# Patient Record
Sex: Male | Born: 2000 | Hispanic: No | Marital: Single | Smoking: Never smoker
Health system: Southern US, Community
[De-identification: ages and names within clinical notes are randomized; demographics above are authoritative.]

## PROBLEM LIST (undated history)

## (undated) DIAGNOSIS — L509 Urticaria, unspecified: Secondary | ICD-10-CM

## (undated) DIAGNOSIS — T783XXA Angioneurotic edema, initial encounter: Secondary | ICD-10-CM

## (undated) HISTORY — DX: Angioneurotic edema, initial encounter: T78.3XXA

## (undated) HISTORY — DX: Urticaria, unspecified: L50.9

---
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
## 2022-01-12 ENCOUNTER — Ambulatory Visit (INDEPENDENT_AMBULATORY_CARE_PROVIDER_SITE_OTHER): Payer: Medicaid Other | Admitting: Pediatrics

## 2022-01-12 ENCOUNTER — Encounter: Payer: Self-pay | Admitting: Pediatrics

## 2022-01-12 ENCOUNTER — Ambulatory Visit
Admission: RE | Admit: 2022-01-12 | Discharge: 2022-01-12 | Disposition: A | Discharge status: Home / Self Care | Payer: Medicaid Other | Source: Ambulatory Visit | Attending: Pediatrics | Admitting: Pediatrics

## 2022-01-12 VITALS — Ht 66.0 in | Wt 148.0 lb

## 2022-01-12 DIAGNOSIS — S43439A Superior glenoid labrum lesion of unspecified shoulder, initial encounter: Secondary | ICD-10-CM | POA: Insufficient documentation

## 2022-01-12 DIAGNOSIS — M25512 Pain in left shoulder: Secondary | ICD-10-CM | POA: Diagnosis not present

## 2022-01-12 NOTE — Progress Notes (Signed)
    Subjective:    Philip Jenkins is a 21 y.o. male  presenting to the clinic today with a chief c/o of  Chief Complaint  Patient presents with   Shoulder Pain   Patient reports to have left shoulder and upper arm pain for the past 4 to 5 months.  He reports to have possibly injured it while playing soccer but unsure of the mechanism of injury. He reports that his arm hurts when he runs or plays soccer. Hurts arm while throwing ball. No h/o any joint swelling. Not using any pain meds.  Review of Systems  Constitutional:  Negative for activity change, appetite change and fever.  HENT:  Negative for congestion.   Respiratory:  Negative for cough.   Gastrointestinal:  Negative for abdominal pain and vomiting.  Musculoskeletal:        Left shoulder pain  Skin:  Negative for rash.       Objective:   Physical Exam Vitals and nursing note reviewed.  Constitutional:      General: He is not in acute distress. HENT:     Head: Normocephalic and atraumatic.     Right Ear: External ear normal.     Left Ear: External ear normal.     Nose: Nose normal.  Eyes:     General:        Right eye: No discharge.        Left eye: No discharge.     Conjunctiva/sclera: Conjunctivae normal.  Cardiovascular:     Rate and Rhythm: Normal rate and regular rhythm.     Heart sounds: Normal heart sounds.  Pulmonary:     Effort: No respiratory distress.     Breath sounds: No wheezing or rales.  Musculoskeletal:        General: Normal range of motion.     Cervical back: Normal range of motion.     Comments: Left shoulder ROM normal, able to extend arm overhead. No tenderness to palpation  Skin:    General: Skin is warm and dry.     Findings: No rash.    .Ht 5\' 6"  (1.676 m)   Wt 148 lb (67.1 kg)   BMI 23.89 kg/m         Assessment & Plan:  1. Left shoulder pain, unspecified chronicity Advised patient to try NSAIDS for a short period of time- 3-5 days. Ice area after play if tender. Patient  plays club soccer & has taken a break as arm movement during running triggers pain. Will refer to Sports med & PT. Xray Shoulder to r/o any bony lesions. - DG Shoulder Left - Ambulatory referral to Sports Medicine - Ambulatory referral to Physical Therapy   Return if symptoms worsen or fail to improve.  , MD 01/12/2022 12:31 PM

## 2022-01-12 NOTE — Patient Instructions (Signed)

## 2022-01-27 ENCOUNTER — Ambulatory Visit: Payer: Self-pay

## 2022-01-27 ENCOUNTER — Encounter: Payer: Self-pay | Admitting: Family Medicine

## 2022-01-27 ENCOUNTER — Ambulatory Visit (INDEPENDENT_AMBULATORY_CARE_PROVIDER_SITE_OTHER): Payer: Medicaid Other | Admitting: Family Medicine

## 2022-01-27 VITALS — BP 116/70 | Ht 68.0 in | Wt 145.0 lb

## 2022-01-27 DIAGNOSIS — S43432A Superior glenoid labrum lesion of left shoulder, initial encounter: Secondary | ICD-10-CM

## 2022-01-27 DIAGNOSIS — M25512 Pain in left shoulder: Secondary | ICD-10-CM

## 2022-01-27 NOTE — Patient Instructions (Signed)
Nice to meet you  Please use ice as needed. Please try the exercises. We will obtain the MRI at [facility] imaging Please send me a message in MyChart with any questions or updates.  We will set up a virtual visit once the MRI is resulted..   --Dr. Jordan Likes

## 2022-01-27 NOTE — Progress Notes (Signed)
  Philip Jenkins - 21 y.o. male MRN [id number]  Date of birth: 10/31/2000  SUBJECTIVE:  Including CC & ROS.  No chief complaint on file.   Philip Jenkins is a 21 y.o. male that is presenting with acute on chronic left shoulder pain.  He had initial injury about 6 to 7 months ago.  He had a fall while playing soccer.  Since that time he has had ongoing pain.  He has limited strength in the shoulder.  Any motion exacerbates his pain.  Review of the office note from 6/21 shows he has tried anti-inflammatories. Independent review of the left shoulder x-ray from 6/21 shows no acute changes.  Review of Systems See HPI   HISTORY: Past Medical, Surgical, Social, and Family History Reviewed & Updated per EMR.   Pertinent Historical Findings include:  Past Medical History:  Diagnosis Date   Angio-edema    Urticaria     History reviewed. No pertinent surgical history.   PHYSICAL EXAM:  VS: BP 116/70 (BP Location: Right Arm, Patient Position: Sitting, Cuff Size: Normal)   Ht 5\' 8"  (1.727 m)   Wt 145 lb (65.8 kg)   BMI 22.05 kg/m  Physical Exam Gen: NAD, alert, cooperative with exam, well-appearing MSK:  Left shoulder: Limited abduction and flexion. Positive grind test. Positive O'Brien's test Neurovascularly intact    Limited ultrasound: Left shoulder:  Effusion noted within the bicipital sheath. Normal-appearing subscapularis. There appears to be effusion at the deep portion of the supraspinatus near the glenohumeral joint. 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  Initial injury was roughly 6 to 7 months ago.  Effusion noted on exam after traumatic injury with clinical exam most consistent with a labral tear.  Recent x-rays were unrevealing. -Counseled on home exercise therapy and supportive  care. -MRI of the left shoulder to evaluate for a labral tear and for presurgical planning.

## 2022-01-27 NOTE — Assessment & Plan Note (Signed)
Acute on chronic in nature.  Initial injury was roughly 6 to 7 months ago.  Effusion noted on exam after traumatic injury with clinical exam most consistent with a labral tear.  Recent x-rays were unrevealing. -Counseled on home exercise therapy and supportive care. -MRI of the left shoulder to evaluate for a labral tear and for presurgical planning.

## 2022-02-03 ENCOUNTER — Ambulatory Visit
Admission: RE | Admit: 2022-02-03 | Discharge: 2022-02-03 | Disposition: A | Discharge status: Home / Self Care | Payer: Medicaid Other | Source: Ambulatory Visit | Attending: Family Medicine | Admitting: Family Medicine

## 2022-02-03 DIAGNOSIS — S43432A Superior glenoid labrum lesion of left shoulder, initial encounter: Secondary | ICD-10-CM

## 2022-02-06 ENCOUNTER — Other Ambulatory Visit: Payer: Medicaid Other

## 2022-02-21 ENCOUNTER — Telehealth (INDEPENDENT_AMBULATORY_CARE_PROVIDER_SITE_OTHER): Payer: Medicaid Other | Admitting: Family Medicine

## 2022-02-21 ENCOUNTER — Encounter: Payer: Self-pay | Admitting: Family Medicine

## 2022-02-21 DIAGNOSIS — S43432D Superior glenoid labrum lesion of left shoulder, subsequent encounter: Secondary | ICD-10-CM

## 2022-02-21 NOTE — Progress Notes (Signed)
Virtual Visit via Video Note  I connected with Hildred Laser on 02/21/22 at  1:00 PM EDT by a video enabled telemedicine application and verified that I am speaking with the correct person using two identifiers.  Location: Patient: home Provider: office   I discussed the limitations of evaluation and management by telemedicine and the availability of in person appointments. The patient expressed understanding and agreed to proceed.  History of Present Illness:  Mr. Philip Jenkins is an 21 year old male that is following up after the MRI of his left shoulder.  The MRI was demonstrating a labral tear and glenoid subchondral marrow edema.   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  Continues to have ongoing pain after his injury.  MRI was demonstrating labral tear after his traumatic event -Counseled on home exercise therapy and supportive care. -Try Sully brace. -Referral to orthopedic surgery.  Follow Up Instructions:    I discussed the assessment and treatment plan with the patient. The patient was provided an opportunity to ask questions and all were answered. The patient agreed with the plan and demonstrated an understanding of the instructions.   The patient was advised to call back or seek an in-person evaluation if the symptoms worsen or if the condition fails to improve as anticipated.    Clare Gandy, MD

## 2022-02-21 NOTE — Assessment & Plan Note (Signed)
Acute on chronic in nature.  Continues to have ongoing pain after his injury.  MRI was demonstrating labral tear after his traumatic event -Counseled on home exercise therapy and supportive care. -Try Sully brace. -Referral to orthopedic surgery.

## 2022-04-18 ENCOUNTER — Encounter: Payer: Self-pay | Admitting: Family Medicine

## 2022-04-27 NOTE — Therapy (Signed)
OUTPATIENT PHYSICAL THERAPY SHOULDER EVALUATION   Patient Name: Philip Jenkins MRN: [id number] DOB:January 06, 2001, 21 y.o., male Today's Date: 04/29/2022   PT End of Session - 04/29/22 2252     Visit Number 1    Number of Visits 7    Date for PT Re-Evaluation 06/17/22    Authorization Type  MEDICAID [employer]    PT Start Time 1315    PT Stop Time 1400    PT Time Calculation (min) 45 min    Activity Tolerance Patient tolerated treatment well    Behavior During Therapy WFL for tasks assessed/performed             Past Medical History:  Diagnosis Date   Angio-edema    Urticaria    History reviewed. No pertinent surgical history. 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   Acne 05/07/2020    PCP: Marijo File, MD  REFERRING PROVIDER: Marijo File, MD  REFERRING DIAG: [phone] (ICD-10-CM) - Left shoulder pain, unspecified chronicity  THERAPY DIAG:  Chronic left shoulder pain - Plan: PT plan of care cert/re-cert  Muscle weakness (generalized) - Plan: PT plan of care cert/re-cert  Rationale for Evaluation and Treatment Rehabilitation  ONSET DATE: Approx 1 year ago  SUBJECTIVE:                                                                                                                                                                                      SUBJECTIVE STATEMENT: Pt reports he injured his L shoulder either playing indoor soccer when his L shoulder was hit into the sidewall or lifting weights. Pt reports he tried wearing a shoulder brace when playing soccer, but it did not help. Pt was playing competitive recreational soccer, now he plays with his friends and they are careful not to hit his L shoulder.  PERTINENT HISTORY: NA  PAIN:  Are you having pain? Yes: NPRS scale: 8/10 Pain location: post L GH area Pain description: infrequent and sharp;  occasional popping Aggravating factors: certain movements of his L arm when playing soccer, sleeping on his L shoulder, reaching behind Relieving factors: Rest  PRECAUTIONS: None  WEIGHT BEARING RESTRICTIONS No  FALLS:  Has patient fallen in last 6 months? No  LIVING ENVIRONMENT: Lives with: lives with their family Lives in: House/apartment No issues accessing or mobility in home  OCCUPATION: Student  PLOF: Independent  PATIENT GOALS To not have L shoulder pain  OBJECTIVE:   DIAGNOSTIC FINDINGS:  Limited ultrasound: Left shoulder: 01/27/22   Effusion noted within the bicipital sheath. Normal-appearing subscapularis. There appears  to be effusion at the deep portion of the supraspinatus near the glenohumeral joint. No changes in the posterior glenohumeral joint   Summary: Findings most consistent with a labral tear   Ultrasound and interpretation by Clearance Coots, MD  MRIL shoulder 02/05/22 IMPRESSION: 1. Intact rotator cuff. 2. There is marrow edema and cortical concavity within the posterolateral humeral head. There is a cartilage defect and chondrolabral tear within the anterior glenoid labrum at the 9 o'clock location. Although the humeral injury is lower than a conventional "Hill-Sachs" lesion, these findings may be secondary to the sequela of prior anterior shoulder dislocation. Recommend clinical correlation.    PATIENT SURVEYS:  Quick Dash 50%  COGNITION:  Overall cognitive status: Within functional limits for tasks assessed     SENSATION: WFL  POSTURE: WNLs  UPPER EXTREMITY ROM:   L WNLs and equal to the R. Pain is intermittent with L shoulder abd Active ROM Right eval Left eval  Shoulder flexion    Shoulder extension    Shoulder abduction    Shoulder adduction    Shoulder internal rotation    Shoulder external rotation    Elbow flexion    Elbow extension    Wrist flexion    Wrist extension    Wrist ulnar deviation    Wrist radial  deviation    Wrist pronation    Wrist supination    (Blank rows = not tested)  UPPER EXTREMITY MMT:  MMT Right eval Left eval  Shoulder flexion 5 5  Shoulder extension 5 5  Shoulder abduction 5 5  Shoulder adduction 5 5  Shoulder internal rotation 5 5  Shoulder external rotation 5 4+ discomfort  Middle trapezius    Lower trapezius    Elbow flexion    Elbow extension    Wrist flexion    Wrist extension    Wrist ulnar deviation    Wrist radial deviation    Wrist pronation    Wrist supination    Grip strength (lbs)    (Blank rows = not tested)  SHOULDER SPECIAL TESTS:  Impingement tests: Hawkins/Kennedy impingement test: negative  SLAP lesions: Crank test: positive , Biceps load test: negative, and Clunk test: negative  Instability tests: Posterior drawer test: negative  Rotator cuff assessment: Empty can test: negative and Full can test: negative  Biceps assessment: Yergason's test: negative and Speed's test: negative  Crank test was inconsistently painful  JOINT MOBILITY TESTING:  Anterior, posterior and lateral mobs were stable  PALPATION:  TTP on the post [place] area   TODAY'S TREATMENT:  [facility] Adult PT Treatment:                                                DATE: 04/29/22 Therapeutic Exercise: Shoulder External Rotation with Anchored Resistance  GTB 10 reps - 3 hold Standing Shoulder Internal Rotation with Anchored Resistance GTB 10 reps - 3 hold Standing Shoulder Row with Anchored Resistance  GTB 10 reps - 3 hold Shoulder extension with resistance GTB 10 reps - 3 hold   PATIENT EDUCATION: Education details: Eval findings, POC, HEP Person educated: Patient Education method: Explanation, Demonstration, Tactile cues, Verbal cues, and Handouts Education comprehension: verbalized understanding, returned demonstration, verbal cues required, and tactile cues required   HOME EXERCISE PROGRAM: Access Code: [email or web address] URL: [email or web address] Date:  04/29/2022 Prepared by: Gar Ponto  Exercises -  Shoulder External Rotation with Anchored Resistance  - 1 x daily - 7 x weekly - 3 sets - 10 reps - 3 hold - Standing Shoulder Internal Rotation with Anchored Resistance  - 1 x daily - 7 x weekly - 3 sets - 10 reps - 3 hold - Standing Shoulder Row with Anchored Resistance  - 1 x daily - 7 x weekly - 3 sets - 10 reps - 3 hold - Shoulder extension with resistance - Neutral  - 1 x daily - 7 x weekly - 3 sets - 10 reps - 3 hold  ASSESSMENT:  CLINICAL IMPRESSION: Patient is a 21 y.o. male who was seen today for physical therapy evaluation and treatment for L shoulder pain. Pt presents with intermittent signs and symptoms consistent with a labral tear and ER tendinopathy of the L shoulder. PT was initiated for L shoulder RC and peri-scapular strengthening which the pt toleratd without adverse effects.   OBJECTIVE IMPAIRMENTS decreased strength and pain.   ACTIVITY LIMITATIONS sleeping and certain l shoulder movements, playing soccer  PARTICIPATION LIMITATIONS:  competitive recreation soccer  PERSONAL FACTORS Past/current experiences and Time since onset of injury/illness/exacerbation are also affecting patient's functional outcome.   REHAB POTENTIAL: Good  CLINICAL DECISION MAKING: Evolving/moderate complexity  EVALUATION COMPLEXITY: Moderate   GOALS:   SHORT TERM GOALS: Target date: 05/13/2022   Pt will be Ind in an initial HEP Baseline:initiated Goal status: INITIAL  LONG TERM GOALS: Target date: 06/17/22  Pt will be Ind in a final HEP to maintain achieved LOF Baseline: initiated Goal status: INITIAL  2.  Pt will be able to complete competitive recreation soccer with infrequent L shoulder pain at a 3/10 or less level Baseline:  Goal status: INITIAL  3.  Icrease L shoulder ER strength to 5/5 for improved function Baseline: 4+/5 Goal status: INITIAL   PLAN: PT FREQUENCY: 1x/week  PT DURATION: 6 weeks  PLANNED  INTERVENTIONS: Therapeutic exercises, Therapeutic activity, Patient/Family education, Self Care, Joint mobilization, Dry Needling, Electrical stimulation, Cryotherapy, Moist heat, Taping, Ultrasound, Ionotophoresis 4mg /ml Dexamethasone, Manual therapy, and Re-evaluation  PLAN FOR NEXT SESSION: Review ; assess response to HEP; progress therex as indicated; use of modalities, manual therapy; and TPDN as indicated.    Wayden Schwertner MS, PT 04/29/22 10:54 PM  Check all possible CPT codes: 06/29/22 - PT Re-evaluation, 97110- Therapeutic Exercise, 97530 - Therapeutic Activities, 97535 - Self Care, 97014 - Electrical stimulation (unattended), 57262 - Electrical stimulation (Manual), [id number] - Iontophoresis, and [id number] - Ultrasound     If treatment provided at initial evaluation, no treatment charged due to lack of authorization.

## 2022-04-29 ENCOUNTER — Ambulatory Visit: Payer: Medicaid Other | Attending: Pediatrics

## 2022-04-29 DIAGNOSIS — G8929 Other chronic pain: Secondary | ICD-10-CM | POA: Diagnosis present

## 2022-04-29 DIAGNOSIS — M6281 Muscle weakness (generalized): Secondary | ICD-10-CM | POA: Insufficient documentation

## 2022-04-29 DIAGNOSIS — M25512 Pain in left shoulder: Secondary | ICD-10-CM | POA: Insufficient documentation

## 2022-05-04 ENCOUNTER — Ambulatory Visit: Payer: Medicaid Other

## 2022-05-11 NOTE — Therapy (Signed)
OUTPATIENT PHYSICAL THERAPY TREATMENT NOTE   Patient Name: Philip Jenkins MRN: [id number] DOB:[birth year], 21 y.o., male Today's Date: 05/12/2022  PCP: Marijo File, MD REFERRING PROVIDER: Marijo File, MD  END OF SESSION:   PT End of Session - 05/12/22 1510     Visit Number 2    Number of Visits 7    Date for PT Re-Evaluation 06/17/22    Authorization Type [place] [employer]ICA[employer]    PT Start Time 1503    PT Stop Time 1545    PT Time Calculation (min) 42 min    Activity Tolerance Patient tolerated treatment well    Behavior During Therapy WFL for tasks assessed/performed             Past Medical History:  Diagnosis Date   Angio-edema    Urticaria    History reviewed. No pertinent surgical history. 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 He notes playing non-contact soccer at 75% and did not experince pain. He has an ortho appt with Dr. Ave Filter on Nov1. He did experience an odd sensation with stretching his arms out when yawning.   PAIN:  Are you having pain? Yes: NPRS scale: 0/10 Pain location: post L GH area Pain description: infrequent and sharp; occasional popping Aggravating factors: certain movements of his L arm when playing soccer, sleeping on his L shoulder,  reaching behind Relieving factors: Rest   PATIENT GOALS To not have L shoulder pain   OBJECTIVE: (objective measures completed at initial evaluation unless otherwise dated)   DIAGNOSTIC FINDINGS:  Limited ultrasound: Left shoulder: 01/27/22   Effusion noted within the bicipital sheath. Normal-appearing subscapularis. There appears to be effusion at the deep portion of the supraspinatus near the glenohumeral joint. No changes in the posterior glenohumeral joint   Summary: Findings most consistent with a labral tear   Ultrasound and interpretation by Clare Gandy, MD   MRIL shoulder 02/05/22 IMPRESSION: 1. Intact rotator cuff. 2. There is marrow edema and  cortical concavity within the posterolateral humeral head. There is a cartilage defect and chondrolabral tear within the anterior glenoid labrum at the 9 o'clock location. Although the humeral injury is lower than a conventional "Hill-Sachs" lesion, these findings may be secondary to the sequela of prior anterior shoulder dislocation. Recommend clinical correlation.     PATIENT SURVEYS:  Quick Dash 50%   COGNITION:           Overall cognitive status: Within functional limits for tasks assessed                                  SENSATION: WFL   POSTURE: WNLs   UPPER EXTREMITY ROM:             L WNLs and equal to the R. Pain is intermittent with L shoulder abd Active ROM Right eval Left eval  Shoulder flexion      Shoulder extension      Shoulder abduction      Shoulder adduction      Shoulder internal rotation      Shoulder external rotation      Elbow flexion      Elbow extension      Wrist flexion      Wrist extension      Wrist ulnar deviation      Wrist radial deviation      Wrist pronation      Wrist supination      (Blank rows = not tested)   UPPER EXTREMITY MMT:   MMT Right eval Left eval  Shoulder flexion 5 5  Shoulder extension 5 5  Shoulder abduction 5 5  Shoulder adduction 5 5  Shoulder  internal rotation 5 5  Shoulder external rotation 5 4+ discomfort  Middle trapezius      Lower trapezius      Elbow flexion      Elbow extension      Wrist flexion      Wrist extension      Wrist ulnar deviation      Wrist radial deviation      Wrist pronation      Wrist supination      Grip strength (lbs)      (Blank rows = not tested)   SHOULDER SPECIAL TESTS:            Impingement tests: Hawkins/Kennedy impingement test: negative            SLAP lesions: Crank test: positive , Biceps load test: negative, and Clunk test: negative            Instability tests: Posterior drawer test: negative            Rotator cuff assessment: Empty can test: negative and Full can test: negative            Biceps assessment: Yergason's test: negative and Speed's test: negative            Crank test was inconsistently painful   JOINT MOBILITY TESTING:  Anterior, posterior and lateral mobs were stable   PALPATION:  TTP on the post GH jt area             TODAY'S TREATMENT:   [facility] Adult PT Treatment:  DATE: 05/12/22 Therapeutic Exercise: UBE L1 2 mins forward/backwards Counter push up 3x10 Counter shoulder taps 3x10 each Wall lateral plank L UE press outs 3x10 Shoulder External Rotation with Anchored Resistance  BluTB 10 reps - 3 hold Standing Shoulder Internal Rotation with Anchored Resistance BluTB 10 reps - 3 hold Standing Shoulder Row with Anchored Resistance  BluTB 10 reps - 3 hold Shoulder extension with resistance BluTB 10 reps - 3 hold HEP updated   [facility] Adult PT Treatment:                                                DATE: 04/29/22 Therapeutic Exercise: Shoulder External Rotation with Anchored Resistance  GTB 10 reps - 3 hold Standing Shoulder Internal Rotation with Anchored Resistance GTB 10 reps - 3 hold Standing Shoulder Row with Anchored Resistance  GTB 10 reps - 3 hold Shoulder extension with resistance GTB 10 reps - 3 hold      PATIENT EDUCATION: Education details: Eval findings, POC, HEP Person educated: Patient Education method: Explanation, Demonstration, Tactile cues, Verbal cues, and Handouts Education comprehension: verbalized understanding, returned demonstration, verbal cues required, and tactile cues required     HOME EXERCISE PROGRAM: Access Code: [email or web address] URL: [email or web address] Date: 05/12/2022 Prepared by: Gar Ponto  Exercises - Shoulder External Rotation with Anchored Resistance  - 1 x daily - 7 x weekly - 3 sets - 10 reps - 3 hold - Standing Shoulder Internal Rotation with Anchored Resistance  - 1 x daily - 7 x weekly - 3 sets - 10 reps - 3 hold - Standing Shoulder Row with Anchored Resistance  - 1 x daily - 7 x weekly - 3 sets - 10 reps - 3 hold - Shoulder extension with resistance - Neutral  - 1 x daily - 7 x weekly - 3 sets - 10 reps - 3 hold - Push Up on Table  - 1 x daily - 7 x weekly - 3 sets - 10 reps - 3 hold - Shoulder Taps on Table  - 1 x daily - 7 x weekly - 3 sets - 10 reps - 3 hold - Standing Full Side Plank on Wall  - 1 x daily - 7 x weekly - 3 sets - 10 reps   ASSESSMENT:   CLINICAL IMPRESSION: Pt's subjective report indicates improved pain since the last PT session even with playing non-contact soccer at 75%. PT was completed for L shoulder strengthening with CKC stability therex added. Verbal cueing was provided for proper completion with pt returning demonstration. Pt tolerated PT today without adverse effects. Pt will continue to benefit from skilled PT to address impairments for improved function with less pain.   OBJECTIVE IMPAIRMENTS decreased strength and pain.    ACTIVITY LIMITATIONS sleeping and certain l shoulder movements, playing soccer   PARTICIPATION LIMITATIONS:  competitive recreation soccer   PERSONAL FACTORS Past/current experiences and Time since onset of injury/illness/exacerbation are also affecting patient's functional outcome.     REHAB POTENTIAL: Good   CLINICAL DECISION MAKING: Evolving/moderate complexity   EVALUATION COMPLEXITY: Moderate     GOALS:     SHORT TERM GOALS: Target date: 05/13/2022    Pt will be Ind in an initial HEP Baseline:initiated Goal status: INITIAL   LONG TERM GOALS: Target date: 06/17/22   Pt will be Ind in a final HEP to maintain achieved  LOF Baseline: initiated Goal status: INITIAL   2.  Pt will be able to complete competitive recreation soccer with infrequent L shoulder pain at a 3/10 or less level Baseline:  Goal status: INITIAL   3.  Icrease L shoulder ER strength to 5/5 for improved function Baseline: 4+/5 Goal status: INITIAL     PLAN: PT FREQUENCY: 1x/week   PT DURATION: 6 weeks   PLANNED INTERVENTIONS: Therapeutic exercises, Therapeutic activity, Patient/Family education, Self Care, Joint mobilization, Dry Needling, Electrical stimulation, Cryotherapy, Moist heat, Taping, Ultrasound, Ionotophoresis 4mg /ml Dexamethasone, Manual therapy, and Re-evaluation   PLAN FOR NEXT SESSION: Review ; assess response to HEP; progress therex as indicated; use of modalities, manual therapy; and TPDN as indicated.     Vaibhav Fogleman MS, PT 05/12/22 5:31 PM

## 2022-05-12 ENCOUNTER — Ambulatory Visit: Payer: Medicaid Other

## 2022-05-12 DIAGNOSIS — M25512 Pain in left shoulder: Secondary | ICD-10-CM | POA: Diagnosis not present

## 2022-05-12 DIAGNOSIS — G8929 Other chronic pain: Secondary | ICD-10-CM

## 2022-05-12 DIAGNOSIS — M6281 Muscle weakness (generalized): Secondary | ICD-10-CM

## 2022-05-17 NOTE — Therapy (Signed)
OUTPATIENT PHYSICAL THERAPY TREATMENT NOTE   Patient Name: Philip Jenkins MRN: [id number] DOB:05-25-2001, 21 y.o., male Today's Date: 05/18/2022  PCP: Ok Edwards, MD REFERRING PROVIDER: Ok Edwards, MD  END OF SESSION:   PT End of Session - 05/18/22 1657     Visit Number 3    Number of Visits 7    Date for PT Re-Evaluation 06/17/22    Authorization Type [place] MEDICAID [employer]    PT Start Time 1551    PT Stop Time 1634    PT Time Calculation (min) 43 min    Activity Tolerance Patient tolerated treatment well    Behavior During Therapy WFL for tasks assessed/performed              Past Medical History:  Diagnosis Date   Angio-edema    Urticaria    History reviewed. No pertinent surgical history. 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   Acne 05/07/2020    REFERRING DIAG: M25.512 (ICD-10-CM) - Left shoulder pain, unspecified chronicity  THERAPY DIAG:  Chronic left shoulder pain  Muscle weakness (generalized)  Rationale for Evaluation and Treatment Rehabilitation   OBJECTIVE: (objective measures completed at initial evaluation unless otherwise dated)  SUBJECTIVE:                                                                                                                                                                                       SUBJECTIVE STATEMENT: Pt reports no pain since the last PT session, although he has not played any competitive soccer nor completed the yawn/arm stretch motion which has aggravated the L shoulder in the past.   PAIN:  Are you having pain? Yes: NPRS scale: 0/10 Pain location: post L GH area Pain description: infrequent and sharp; occasional popping Aggravating factors: certain movements of his L arm when playing soccer, sleeping on his L shoulder, reaching behind Relieving factors: Rest   PATIENT GOALS To not  have L shoulder pain   OBJECTIVE: (objective measures completed at initial evaluation unless otherwise dated)   DIAGNOSTIC FINDINGS:  Limited ultrasound: Left shoulder: 01/27/22   Effusion noted within the bicipital sheath. Normal-appearing subscapularis. There appears to be effusion at the deep portion of the supraspinatus near the glenohumeral joint. No changes in the posterior glenohumeral joint   Summary: Findings most consistent with a labral tear   Ultrasound and interpretation by Clearance Coots, MD   MRIL shoulder 02/05/22 IMPRESSION: 1. Intact rotator cuff. 2. There is marrow edema and cortical concavity within the posterolateral humeral head. There is a cartilage defect and  chondrolabral tear within the anterior glenoid labrum at the 9 o'clock location. Although the humeral injury is lower than a conventional "Hill-Sachs" lesion, these findings may be secondary to the sequela of prior anterior shoulder dislocation. Recommend clinical correlation.     PATIENT SURVEYS:  Quick Dash 50%   COGNITION:           Overall cognitive status: Within functional limits for tasks assessed                                  SENSATION: WFL   POSTURE: WNLs   UPPER EXTREMITY ROM:             L WNLs and equal to the R. Pain is intermittent with L shoulder abd Active ROM Right eval Left eval  Shoulder flexion      Shoulder extension      Shoulder abduction      Shoulder adduction      Shoulder internal rotation      Shoulder external rotation      Elbow flexion      Elbow extension      Wrist flexion      Wrist extension      Wrist ulnar deviation      Wrist radial deviation      Wrist pronation      Wrist supination      (Blank rows = not tested)   UPPER EXTREMITY MMT:   MMT Right eval Left eval  Shoulder flexion 5 5  Shoulder extension 5 5  Shoulder abduction 5 5  Shoulder adduction 5 5  Shoulder internal rotation 5 5  Shoulder external rotation 5 4+ discomfort   Middle trapezius      Lower trapezius      Elbow flexion      Elbow extension      Wrist flexion      Wrist extension      Wrist ulnar deviation      Wrist radial deviation      Wrist pronation      Wrist supination      Grip strength (lbs)      (Blank rows = not tested)   SHOULDER SPECIAL TESTS:            Impingement tests: Hawkins/Kennedy impingement test: negative            SLAP lesions: Crank test: positive , Biceps load test: negative, and Clunk test: negative            Instability tests: Posterior drawer test: negative            Rotator cuff assessment: Empty can test: negative and Full can test: negative            Biceps assessment: Yergason's test: negative and Speed's test: negative            Crank test was inconsistently painful   JOINT MOBILITY TESTING:  Anterior, posterior and lateral mobs were stable   PALPATION:  TTP on the post [place] jt area             TODAY'S TREATMENT:  [facility] Adult PT Treatment:  DATE: 05/18/22 Therapeutic Exercise: UBE L1 2 mins forward/backwards Push up 2x10 Quadruped shoulder taps 2x10 each Quadruped drag throughs 2x5 each Plank star reach outs 2x3 patern each Wall lateral plank L elbow flex 2x10 Bicep curl 3x10 10# Shoulder press in scapular plane 3x10 10# HEP updated   [facility] Adult PT Treatment:                                                DATE: 05/12/22 Therapeutic Exercise: UBE L1 2 mins forward/backwards Counter push up 3x10 Counter shoulder taps 3x10 each Wall lateral plank L UE press outs 3x10 Shoulder External Rotation with Anchored Resistance  BluTB 10 reps - 3 hold Standing Shoulder Internal Rotation with Anchored Resistance BluTB 10 reps - 3 hold Standing Shoulder Row with Anchored Resistance  BluTB 10 reps - 3 hold Shoulder extension with resistance BluTB 10 reps - 3 hold HEP updated   [facility] Adult PT Treatment:                                                DATE:  04/29/22 Therapeutic Exercise: Shoulder External Rotation with Anchored Resistance  GTB 10 reps - 3 hold Standing Shoulder Internal Rotation with Anchored Resistance GTB 10 reps - 3 hold Standing Shoulder Row with Anchored Resistance  GTB 10 reps - 3 hold Shoulder extension with resistance GTB 10 reps - 3 hold     PATIENT EDUCATION: Education details: Eval findings, POC, HEP Person educated: Patient Education method: Explanation, Demonstration, Tactile cues, Verbal cues, and Handouts Education comprehension: verbalized understanding, returned demonstration, verbal cues required, and tactile cues required     HOME EXERCISE PROGRAM: Access Code: [email or web address] URL: [email or web address] Date: 05/18/2022 Prepared by: Gar Ponto  Exercises - Shoulder External Rotation with Anchored Resistance  - 1 x daily - 7 x weekly - 3 sets - 10 reps - 3 hold - Standing Shoulder Internal Rotation with Anchored Resistance  - 1 x daily - 7 x weekly - 3 sets - 10 reps - 3 hold - Standing Shoulder Row with Anchored Resistance  - 1 x daily - 7 x weekly - 3 sets - 10 reps - 3 hold - Shoulder extension with resistance - Neutral  - 1 x daily - 7 x weekly - 3 sets - 10 reps - 3 hold - Push Up on Table  - 1 x daily - 7 x weekly - 3 sets - 10 reps - 3 hold - Shoulder Taps on Table  - 1 x daily - 7 x weekly - 3 sets - 10 reps - 3 hold - Standing Full Side Plank on Wall  - 1 x daily - 7 x weekly - 3 sets - 10 reps - Shoulder Overhead Press in Abduction with Dumbbells  - 1 x daily - 7 x weekly - 3 sets - 10 reps - Standing Bicep Curls Supinated with Dumbbells  - 1 x daily - 7 x weekly - 3 sets - 10 reps   ASSESSMENT:   CLINICAL IMPRESSION: PT was completed for R shoulder strengthening and stability with progressive demand. Pt tolerated PT today without provocation of L shoulder pain. Pt is ind with a HEP and is completing the therex properly. Pt  has an appt with Dr. Tamera Punt next week for L shoulder  assessment. Course of care for PT will be determined following this appt with the pt returning to PT the next day after. Overall, pt is responding positively to PT.  OBJECTIVE IMPAIRMENTS decreased strength and pain.    ACTIVITY LIMITATIONS sleeping and certain l shoulder movements, playing soccer   PARTICIPATION LIMITATIONS:  competitive recreation soccer   PERSONAL FACTORS Past/current experiences and Time since onset of injury/illness/exacerbation are also affecting patient's functional outcome.    REHAB POTENTIAL: Good   CLINICAL DECISION MAKING: Evolving/moderate complexity   EVALUATION COMPLEXITY: Moderate     GOALS:     SHORT TERM GOALS: Target date: 05/13/2022    Pt will be Ind in an initial HEP Baseline:initiated Goal status: MET   LONG TERM GOALS: Target date: 06/17/22   Pt will be Ind in a final HEP to maintain achieved LOF Baseline: initiated Goal status: INITIAL   2.  Pt will be able to complete competitive recreation soccer with infrequent L shoulder pain at a 3/10 or less level Baseline:  Goal status: INITIAL   3.  Increase L shoulder ER strength to 5/5 for improved function Baseline: 4+/5 Goal status: INITIAL     PLAN: PT FREQUENCY: 1x/week   PT DURATION: 6 weeks   PLANNED INTERVENTIONS: Therapeutic exercises, Therapeutic activity, Patient/Family education, Self Care, Joint mobilization, Dry Needling, Electrical stimulation, Cryotherapy, Moist heat, Taping, Ultrasound, Ionotophoresis 6m/ml Dexamethasone, Manual therapy, and Re-evaluation   PLAN FOR NEXT SESSION: Review QJeanie Cooks assess response to HEP; progress therex as indicated; use of modalities, manual therapy; and TPDN as indicated.     Damonie Ellenwood MS, PT 05/18/22 5:11 PM

## 2022-05-18 ENCOUNTER — Ambulatory Visit: Payer: Medicaid Other

## 2022-05-18 DIAGNOSIS — G8929 Other chronic pain: Secondary | ICD-10-CM

## 2022-05-18 DIAGNOSIS — M6281 Muscle weakness (generalized): Secondary | ICD-10-CM

## 2022-05-18 DIAGNOSIS — M25512 Pain in left shoulder: Secondary | ICD-10-CM | POA: Diagnosis not present

## 2022-05-26 ENCOUNTER — Ambulatory Visit: Payer: Medicaid Other | Attending: Pediatrics

## 2022-05-26 DIAGNOSIS — M25512 Pain in left shoulder: Secondary | ICD-10-CM | POA: Insufficient documentation

## 2022-05-26 DIAGNOSIS — G8929 Other chronic pain: Secondary | ICD-10-CM | POA: Insufficient documentation

## 2022-05-26 DIAGNOSIS — M6281 Muscle weakness (generalized): Secondary | ICD-10-CM | POA: Insufficient documentation

## 2022-05-26 NOTE — Therapy (Signed)
OUTPATIENT PHYSICAL THERAPY TREATMENT NOTE   Patient Name: Philip Jenkins MRN: [id number] DOB:09/23/2000, 21 y.o., male Today's Date: 05/26/2022  PCP: Ok Edwards, MD REFERRING PROVIDER: Ok Edwards, MD  END OF SESSION:   PT End of Session - 05/26/22 1519     Visit Number 4    Number of Visits 7    Date for PT Re-Evaluation 06/17/22    Authorization Type [place] [employer]ICA[employer]    PT Start Time 1510    PT Stop Time 1555    PT Time Calculation (min) 45 min    Activity Tolerance Patient tolerated treatment well    Behavior During Therapy WFL for tasks assessed/performed               Past Medical History:  Diagnosis Date   Angio-edema    Urticaria    History reviewed. No pertinent surgical history. 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 Tamera Punt today and he was told to finish PT and afterward if his L shoulder is still hurting then contact his office. Pt notes playing soccer with light contact this past weekend x2 for 2 hours each and only experienced 1 incident of L shoulder pain which lasted 5 mins.  PAIN:  Are you having pain? Yes: NPRS scale: 0/10 Pain location: post L GH area Pain description: infrequent and sharp; occasional popping Aggravating factors: certain movements of his L arm when  playing soccer, sleeping on his L shoulder, reaching behind Relieving factors: Rest   PATIENT GOALS To not have L shoulder pain   OBJECTIVE: (objective measures completed at initial evaluation unless otherwise dated)   DIAGNOSTIC FINDINGS:  Limited ultrasound: Left shoulder: 01/27/22   Effusion noted within the bicipital sheath. Normal-appearing subscapularis. There appears to be effusion at the deep portion of the supraspinatus near the glenohumeral joint. No changes in the posterior glenohumeral joint   Summary: Findings most consistent with a labral tear   Ultrasound and interpretation by Clearance Coots, MD   MRIL shoulder 02/05/22 IMPRESSION: 1.  Intact rotator cuff. 2. There is marrow edema and cortical concavity within the posterolateral humeral head. There is a cartilage defect and chondrolabral tear within the anterior glenoid labrum at the 9 o'clock location. Although the humeral injury is lower than a conventional "Hill-Sachs" lesion, these findings may be secondary to the sequela of prior anterior shoulder dislocation. Recommend clinical correlation.     PATIENT SURVEYS:  Quick Dash 50%   COGNITION:           Overall cognitive status: Within functional limits for tasks assessed                                  SENSATION: WFL   POSTURE: WNLs   UPPER EXTREMITY ROM:             L WNLs and equal to the R. Pain is intermittent with L shoulder abd Active ROM Right eval Left eval  Shoulder flexion      Shoulder extension      Shoulder abduction      Shoulder adduction      Shoulder internal rotation      Shoulder external rotation      Elbow flexion      Elbow extension      Wrist flexion      Wrist extension      Wrist ulnar deviation      Wrist radial deviation      Wrist pronation      Wrist supination      (Blank rows = not tested)   UPPER EXTREMITY MMT:   MMT Right eval Left eval  Shoulder flexion 5 5  Shoulder extension 5 5  Shoulder  abduction 5 5  Shoulder adduction 5 5  Shoulder internal rotation 5 5  Shoulder external rotation 5 4+ discomfort  Middle trapezius      Lower trapezius      Elbow flexion      Elbow extension      Wrist flexion      Wrist extension      Wrist ulnar deviation      Wrist radial deviation      Wrist pronation      Wrist supination      Grip strength (lbs)      (Blank rows = not tested)   SHOULDER SPECIAL TESTS:            Impingement tests: Hawkins/Kennedy impingement test: negative            SLAP lesions: Crank test: positive , Biceps load test: negative, and Clunk test: negative            Instability tests: Posterior drawer test: negative            Rotator cuff assessment: Empty can test: negative and Full can test: negative            Biceps assessment: Yergason's test: negative and Speed's test: negative            Crank test was inconsistently painful   JOINT MOBILITY TESTING:  Anterior, posterior and lateral mobs were stable   PALPATION:  TTP on the post [place] jt area             TODAY'S TREATMENT:  [facility] Treatment:  DATE: 05/26/22 Therapeutic Exercise: UBE L1 2 mins forward/backwards Shoulder ER stabilization per side steps BluTB 3x8 Shoulder IR stabilization per side steps BluTB 3x8 Trap red ball catch c ecc control 2x20 Omega chest press 45# 3x10 Omega chest add flys 45# 3x10 Omega chest pulls 45# 3x10 Omega bicep curls 35# 3x10  [facility] Adult PT Treatment:                                                DATE: 05/18/22 Therapeutic Exercise: UBE L1 2 mins forward/backwards Push up 2x10 Quadruped shoulder taps 2x10 each Quadruped drag throughs 2x5 each Plank star reach outs 2x3 patern each Wall lateral plank L elbow flex 2x10 Bicep curl 3x10 10# Shoulder press in scapular plane 3x10 10# HEP updated   [facility] Adult PT Treatment:                                                DATE: 05/12/22 Therapeutic  Exercise: UBE L1 2 mins forward/backwards Counter push up 3x10 Counter shoulder taps 3x10 each Wall lateral plank L UE press outs 3x10 Shoulder External Rotation with Anchored Resistance  BluTB 10 reps - 3 hold Standing Shoulder Internal Rotation with Anchored Resistance BluTB 10 reps - 3 hold Standing Shoulder Row with Anchored Resistance  BluTB 10 reps - 3 hold Shoulder extension with resistance BluTB 10 reps - 3 hold HEP updated     PATIENT EDUCATION: Education details: Eval findings, POC, HEP Person educated: Patient Education method: Explanation, Demonstration, Tactile cues, Verbal cues, and Handouts Education comprehension: verbalized understanding, returned demonstration, verbal cues required, and tactile cues required     HOME EXERCISE PROGRAM: Access Code: [email or web address] URL: [email or web address] Date: 05/18/2022 Prepared by: Gar Ponto  Exercises - Shoulder External Rotation with Anchored Resistance  - 1 x daily - 7 x weekly - 3 sets - 10 reps - 3 hold - Standing Shoulder Internal Rotation with Anchored Resistance  - 1 x daily - 7 x weekly - 3 sets - 10 reps - 3 hold - Standing Shoulder Row with Anchored Resistance  - 1 x daily - 7 x weekly - 3 sets - 10 reps - 3 hold - Shoulder extension with resistance - Neutral  - 1 x daily - 7 x weekly - 3 sets - 10 reps - 3 hold - Push Up on Table  - 1 x daily - 7 x weekly - 3 sets - 10 reps - 3 hold - Shoulder Taps on Table  - 1 x daily - 7 x weekly - 3 sets - 10 reps - 3 hold - Standing Full Side Plank on Wall  - 1 x daily - 7 x weekly - 3 sets - 10 reps - Shoulder Overhead Press in Abduction with Dumbbells  - 1 x daily - 7 x weekly - 3 sets - 10 reps - Standing Bicep Curls Supinated with Dumbbells  - 1 x daily - 7 x weekly - 3 sets - 10 reps   ASSESSMENT:   CLINICAL IMPRESSION: With increased activity per soccer, increased time playing and physicality, pt's incidences of L shoulder pian was decreased with only 1  incident. PT was completed for L shoulder strengthening and stability with increased demand  for resistance and testing end range ROM for L shoulder abd and ER. Pt tolerated these progressions without adverse effects. Pt will continue to benefit from skilled PT to address impairments for improved function of the L shoulder.   OBJECTIVE IMPAIRMENTS decreased strength and pain.    ACTIVITY LIMITATIONS sleeping and certain l shoulder movements, playing soccer   PARTICIPATION LIMITATIONS:  competitive recreation soccer   PERSONAL FACTORS Past/current experiences and Time since onset of injury/illness/exacerbation are also affecting patient's functional outcome.    REHAB POTENTIAL: Good   CLINICAL DECISION MAKING: Evolving/moderate complexity   EVALUATION COMPLEXITY: Moderate     GOALS:     SHORT TERM GOALS: Target date: 05/13/2022    Pt will be Ind in an initial HEP Baseline:initiated Goal status: MET   LONG TERM GOALS: Target date: 06/17/22   Pt will be Ind in a final HEP to maintain achieved LOF Baseline: initiated Goal status: INITIAL   2.  Pt will be able to complete competitive recreation soccer with infrequent L shoulder pain at a 3/10 or less level Baseline: Status: 05/26/22  Goal status: Improving   3.  Increase L shoulder ER strength to 5/5 for improved function Baseline: 4+/5 Goal status: INITIAL     PLAN: PT FREQUENCY: 1x/week   PT DURATION: 6 weeks   PLANNED INTERVENTIONS: Therapeutic exercises, Therapeutic activity, Patient/Family education, Self Care, Joint mobilization, Dry Needling, Electrical stimulation, Cryotherapy, Moist heat, Taping, Ultrasound, Ionotophoresis 21m/ml Dexamethasone, Manual therapy, and Re-evaluation   PLAN FOR NEXT SESSION: Review QJeanie Cooks assess response to HEP; progress therex as indicated; use of modalities, manual therapy; and TPDN as indicated. Assess Quick Dash and L shoulder strength per MMT.     Jamara Vary MS, PT 05/26/22  4:28 PM

## 2022-05-31 ENCOUNTER — Ambulatory Visit: Payer: Medicaid Other

## 2022-06-01 ENCOUNTER — Ambulatory Visit: Payer: Medicaid Other

## 2022-06-01 NOTE — Therapy (Incomplete)
OUTPATIENT PHYSICAL THERAPY TREATMENT NOTE   Patient Name: Philip Jenkins MRN: [id number] DOB:29-Mar-2001, 21 y.o., male Today's Date: 06/01/2022  PCP: Ok Edwards, MD REFERRING PROVIDER: Ok Edwards, MD  END OF SESSION:       Past Medical History:  Diagnosis Date   Angio-edema    Urticaria    No past surgical history on file. 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  Rationale for Evaluation and Treatment Rehabilitation   OBJECTIVE: (objective measures completed at initial evaluation unless otherwise dated)  SUBJECTIVE:                                                                                                                                                                                       SUBJECTIVE STATEMENT: Pt reports seeing Dr. Tamera Punt today and he was told to finish PT and afterward if his L shoulder is still hurting then contact his office. Pt notes playing soccer with light contact this past weekend x2 for 2 hours each and only experienced 1 incident of L shoulder pain which lasted 5 mins.  PAIN:  Are you having pain? Yes: NPRS scale: 0/10 Pain location: post L GH area Pain description: infrequent and sharp; occasional popping Aggravating factors: certain movements of his L arm when playing soccer, sleeping on his L shoulder, reaching behind Relieving factors: Rest   PATIENT GOALS To not have L shoulder pain   OBJECTIVE: (objective measures completed at initial evaluation unless otherwise dated)   DIAGNOSTIC FINDINGS:  Limited ultrasound: Left shoulder: 01/27/22   Effusion noted within the bicipital sheath. Normal-appearing subscapularis. There appears to be effusion at the deep portion of the supraspinatus near the  glenohumeral joint. No changes in the posterior glenohumeral joint   Summary: Findings most consistent with a labral tear   Ultrasound and interpretation by Clearance Coots, MD   MRIL shoulder 02/05/22 IMPRESSION: 1. Intact rotator cuff. 2. There is marrow edema and cortical concavity within the posterolateral humeral head. There is a cartilage defect and chondrolabral tear within the anterior glenoid labrum at the 9 o'clock location. Although the humeral injury is lower than a conventional "Hill-Sachs" lesion, these findings may be secondary to the sequela of prior anterior shoulder dislocation. Recommend clinical correlation.     PATIENT SURVEYS:  Quick Dash 50%   COGNITION:           Overall cognitive status: Within functional limits for tasks assessed  SENSATION: WFL   POSTURE: WNLs   UPPER EXTREMITY ROM:             L WNLs and equal to the R. Pain is intermittent with L shoulder abd Active ROM Right eval Left eval  Shoulder flexion      Shoulder extension      Shoulder abduction      Shoulder adduction      Shoulder internal rotation      Shoulder external rotation      Elbow flexion      Elbow extension      Wrist flexion      Wrist extension      Wrist ulnar deviation      Wrist radial deviation      Wrist pronation      Wrist supination      (Blank rows = not tested)   UPPER EXTREMITY MMT:   MMT Right eval Left eval  Shoulder flexion 5 5  Shoulder extension 5 5  Shoulder abduction 5 5  Shoulder adduction 5 5  Shoulder internal rotation 5 5  Shoulder external rotation 5 4+ discomfort  Middle trapezius      Lower trapezius      Elbow flexion      Elbow extension      Wrist flexion      Wrist extension      Wrist ulnar deviation      Wrist radial deviation      Wrist pronation      Wrist supination      Grip strength (lbs)      (Blank rows = not tested)   SHOULDER SPECIAL TESTS:            Impingement  tests: Hawkins/Kennedy impingement test: negative            SLAP lesions: Crank test: positive , Biceps load test: negative, and Clunk test: negative            Instability tests: Posterior drawer test: negative            Rotator cuff assessment: Empty can test: negative and Full can test: negative            Biceps assessment: Yergason's test: negative and Speed's test: negative            Crank test was inconsistently painful   JOINT MOBILITY TESTING:  Anterior, posterior and lateral mobs were stable   PALPATION:  TTP on the post [place] jt area             TODAY'S TREATMENT:  [facility] Adult PT Treatment:                                                DATE: 11.8.23 Therapeutic Exercise: *** Manual Therapy: *** Neuromuscular re-ed: *** Therapeutic Activity: *** Modalities: *** Self Care: ***  [facility] Adult PT Treatment:                                                DATE: 05/26/22 Therapeutic Exercise: UBE L1 2 mins forward/backwards Shoulder ER stabilization per side steps BluTB 3x8 Shoulder IR stabilization per side steps BluTB 3x8 Trap red ball catch c ecc control 2x20  Omega chest press 45# 3x10 Omega chest add flys 45# 3x10 Omega chest pulls 45# 3x10 Omega bicep curls 35# 3x10  [facility] Adult PT Treatment:                                                DATE: 05/18/22 Therapeutic Exercise: UBE L1 2 mins forward/backwards Push up 2x10 Quadruped shoulder taps 2x10 each Quadruped drag throughs 2x5 each Plank star reach outs 2x3 patern each Wall lateral plank L elbow flex 2x10 Bicep curl 3x10 10# Shoulder press in scapular plane 3x10 10# HEP updated   [facility] PT Treatment:                                                DATE: 05/12/22 Therapeutic Exercise: UBE L1 2 mins forward/backwards Counter push up 3x10 Counter shoulder taps 3x10 each Wall lateral plank L UE press outs 3x10 Shoulder External Rotation with Anchored Resistance  BluTB 10 reps - 3 hold Standing Shoulder  Internal Rotation with Anchored Resistance BluTB 10 reps - 3 hold Standing Shoulder Row with Anchored Resistance  BluTB 10 reps - 3 hold Shoulder extension with resistance BluTB 10 reps - 3 hold HEP updated     PATIENT EDUCATION: Education details: Eval findings, POC, HEP Person educated: Patient Education method: Explanation, Demonstration, Tactile cues, Verbal cues, and Handouts Education comprehension: verbalized understanding, returned demonstration, verbal cues required, and tactile cues required     HOME EXERCISE PROGRAM: Access Code: [email or web address] URL: [email or web address] Date: 05/18/2022 Prepared by: Gar Ponto  Exercises - Shoulder External Rotation with Anchored Resistance  - 1 x daily - 7 x weekly - 3 sets - 10 reps - 3 hold - Standing Shoulder Internal Rotation with Anchored Resistance  - 1 x daily - 7 x weekly - 3 sets - 10 reps - 3 hold - Standing Shoulder Row with Anchored Resistance  - 1 x daily - 7 x weekly - 3 sets - 10 reps - 3 hold - Shoulder extension with resistance - Neutral  - 1 x daily - 7 x weekly - 3 sets - 10 reps - 3 hold - Push Up on Table  - 1 x daily - 7 x weekly - 3 sets - 10 reps - 3 hold - Shoulder Taps on Table  - 1 x daily - 7 x weekly - 3 sets - 10 reps - 3 hold - Standing Full Side Plank on Wall  - 1 x daily - 7 x weekly - 3 sets - 10 reps - Shoulder Overhead Press in Abduction with Dumbbells  - 1 x daily - 7 x weekly - 3 sets - 10 reps - Standing Bicep Curls Supinated with Dumbbells  - 1 x daily - 7 x weekly - 3 sets - 10 reps   ASSESSMENT:   CLINICAL IMPRESSION: With increased activity per soccer, increased time playing and physicality, pt's incidences of L shoulder pian was decreased with only 1 incident. PT was completed for L shoulder strengthening and stability with increased demand for resistance and testing end range ROM for L shoulder abd and ER. Pt tolerated these progressions without adverse effects. Pt will continue to  benefit from skilled PT to address impairments for improved  function of the L shoulder.   OBJECTIVE IMPAIRMENTS decreased strength and pain.    ACTIVITY LIMITATIONS sleeping and certain l shoulder movements, playing soccer   PARTICIPATION LIMITATIONS:  competitive recreation soccer   PERSONAL FACTORS Past/current experiences and Time since onset of injury/illness/exacerbation are also affecting patient's functional outcome.    REHAB POTENTIAL: Good   CLINICAL DECISION MAKING: Evolving/moderate complexity   EVALUATION COMPLEXITY: Moderate     GOALS:     SHORT TERM GOALS: Target date: 05/13/2022    Pt will be Ind in an initial HEP Baseline:initiated Goal status: MET   LONG TERM GOALS: Target date: 06/17/22   Pt will be Ind in a final HEP to maintain achieved LOF Baseline: initiated Goal status: INITIAL   2.  Pt will be able to complete competitive recreation soccer with infrequent L shoulder pain at a 3/10 or less level Baseline: Status: 05/26/22  Goal status: Improving   3.  Increase L shoulder ER strength to 5/5 for improved function Baseline: 4+/5 Goal status: INITIAL     PLAN: PT FREQUENCY: 1x/week   PT DURATION: 6 weeks   PLANNED INTERVENTIONS: Therapeutic exercises, Therapeutic activity, Patient/Family education, Self Care, Joint mobilization, Dry Needling, Electrical stimulation, Cryotherapy, Moist heat, Taping, Ultrasound, Ionotophoresis 26m/ml Dexamethasone, Manual therapy, and Re-evaluation   PLAN FOR NEXT SESSION: Review QJeanie Cooks assess response to HEP; progress therex as indicated; use of modalities, manual therapy; and TPDN as indicated. Assess Quick Dash and L shoulder strength per MMT.     Crystel Demarco MS, PT 06/01/22 5:55 AM

## 2022-06-07 ENCOUNTER — Ambulatory Visit: Payer: Medicaid Other

## 2022-06-07 DIAGNOSIS — M25512 Pain in left shoulder: Secondary | ICD-10-CM | POA: Diagnosis not present

## 2022-06-07 DIAGNOSIS — G8929 Other chronic pain: Secondary | ICD-10-CM | POA: Diagnosis present

## 2022-06-07 DIAGNOSIS — M6281 Muscle weakness (generalized): Secondary | ICD-10-CM | POA: Diagnosis present

## 2022-06-07 NOTE — Therapy (Signed)
OUTPATIENT PHYSICAL THERAPY TREATMENT NOTE   Patient Name: Paige Vanderwoude MRN: [id number] DOB:17-Jul-2001, 21 y.o., male Today's Date: 06/07/2022  PCP: Ok Edwards, MD REFERRING PROVIDER: Ok Edwards, MD  END OF SESSION:   PT End of Session - 06/07/22 1509     Visit Number 5    Number of Visits 7    Date for PT Re-Evaluation 06/17/22    Authorization Type [place] MEDICAID [employer]    PT Start Time 1503    PT Stop Time 1545    PT Time Calculation (min) 42 min    Activity Tolerance Patient tolerated treatment well    Behavior During Therapy WFL for tasks assessed/performed                Past Medical History:  Diagnosis Date   Angio-edema    Urticaria    History reviewed. No pertinent surgical history. 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 L shoulder has been pain free except for when he fell on his L shoulder playing soccer. He reports experiencing significant pain which decreased gradually over 2 days.  PAIN:  Are you having pain? Yes: NPRS scale: 0/10 Pain location: post L GH area Pain description: infrequent and sharp; occasional popping Aggravating factors: certain movements of his L arm when playing soccer, sleeping on his L shoulder, reaching behind Relieving factors:  Rest   PATIENT GOALS To not have L shoulder pain   OBJECTIVE: (objective measures completed at initial evaluation unless otherwise dated)   DIAGNOSTIC FINDINGS:  Limited ultrasound: Left shoulder: 01/27/22   Effusion noted within the bicipital sheath. Normal-appearing subscapularis. There appears to be effusion at the deep portion of the supraspinatus near the glenohumeral joint. No changes in the posterior glenohumeral joint   Summary: Findings most consistent with a labral tear   Ultrasound and interpretation by Clearance Coots, MD   MRIL shoulder 02/05/22 IMPRESSION: 1. Intact rotator cuff. 2. There is marrow edema and cortical concavity within the posterolateral humeral head. There is  a cartilage defect and chondrolabral tear within the anterior glenoid labrum at the 9 o'clock location. Although the humeral injury is lower than a conventional "Hill-Sachs" lesion, these findings may be secondary to the sequela of prior anterior shoulder dislocation. Recommend clinical correlation.     PATIENT SURVEYS:  Quick Dash 50%   COGNITION:           Overall cognitive status: Within functional limits for tasks assessed                                  SENSATION: WFL   POSTURE: WNLs   UPPER EXTREMITY ROM:             L WNLs and equal to the R. Pain is intermittent with L shoulder abd Active ROM Right eval Left eval  Shoulder flexion      Shoulder extension      Shoulder abduction      Shoulder adduction      Shoulder internal rotation      Shoulder external rotation      Elbow flexion      Elbow extension      Wrist flexion      Wrist extension      Wrist ulnar deviation      Wrist radial deviation      Wrist pronation      Wrist supination      (Blank rows = not tested)   UPPER EXTREMITY MMT:   MMT Right eval Left eval RT 06/07/22 LT 06/07/22  Shoulder flexion 5 5    Shoulder extension 5 5    Shoulder abduction 5 5    Shoulder adduction 5 5    Shoulder  internal rotation 5 5    Shoulder external rotation 5 4+ discomfort 5/5 HHD=27.0 ave 5/5 no discomfort HHD=24.3 ave   Middle trapezius        Lower trapezius        Elbow flexion        Elbow extension        Wrist flexion        Wrist extension        Wrist ulnar deviation        Wrist radial deviation        Wrist pronation        Wrist supination        Grip strength (lbs)        (Blank rows = not tested)   SHOULDER SPECIAL TESTS:            Impingement tests: Hawkins/Kennedy impingement test: negative            SLAP lesions: Crank test: positive , Biceps load test: negative, and Clunk test: negative            Instability tests: Posterior drawer test: negative            Rotator cuff assessment: Empty can test: negative and Full can test: negative            Biceps assessment: Yergason's test: negative and Speed's test: negative            Crank test was inconsistently painful   JOINT MOBILITY TESTING:  Anterior, posterior and lateral mobs were stable   PALPATION:  TTP on the post [place] jt area             TODAY'S TREATMENT:  [facility] Adult  PT Treatment:                                                DATE: 06/07/22 Therapeutic Exercise: UBE L2 2 mins forward/backwards Wall lateral plank L elbow single arm push up 2x15 Push ups 2x10 Push ups/low like position 2x8 Counter push up claps 2x10 Perturbations with green swiss ball in supine and standing 2 sets each 2 handed wall clocks c GTB 3 sets HH dynamometer strength testing for shoulder ER Large gentle shoulder circles x10 each direction  [facility] Adult PT Treatment:                                                DATE: 05/26/22 Therapeutic Exercise: UBE L1 2 mins forward/backwards Shoulder ER stabilization per side steps BluTB 3x8 Shoulder IR stabilization per side steps BluTB 3x8 Trap red ball catch c ecc control 2x20 Omega chest press 45# 3x10 Omega chest add flys 45# 3x10 Omega chest pulls 45# 3x10 Omega bicep curls  35# 3x10  [facility] Adult PT Treatment:                                                DATE: 05/18/22 Therapeutic Exercise: UBE L1 2 mins forward/backwards Push up 2x10 Quadruped shoulder taps 2x10 each Quadruped drag throughs 2x5 each Plank star reach outs 2x3 patern each Wall lateral plank L elbow flex 2x10 Bicep curl 3x10 10# Shoulder press in scapular plane 3x10 10# HEP updated   PATIENT EDUCATION: Education details: Eval findings, POC, HEP Person educated: Patient Education method: Explanation, Demonstration, Tactile cues, Verbal cues, and Handouts Education comprehension: verbalized understanding, returned demonstration, verbal cues required, and tactile cues required     HOME EXERCISE PROGRAM: Access Code: [email or web address] URL: [email or web address] Date: 05/18/2022 Prepared by: Gar Ponto  Exercises - Shoulder External Rotation with Anchored Resistance  - 1 x daily - 7 x weekly - 3 sets - 10 reps - 3 hold - Standing Shoulder Internal Rotation with Anchored Resistance  - 1 x daily - 7 x weekly - 3 sets - 10 reps - 3 hold - Standing Shoulder Row with Anchored Resistance  - 1 x daily - 7 x weekly - 3 sets - 10 reps - 3 hold - Shoulder extension with resistance - Neutral  - 1 x daily - 7 x weekly - 3 sets - 10 reps - 3 hold - Push Up on Table  - 1 x daily - 7 x weekly - 3 sets - 10 reps - 3 hold - Shoulder Taps on Table  - 1 x daily - 7 x weekly - 3 sets - 10 reps - 3 hold - Standing Full Side Plank on Wall  - 1 x daily - 7 x weekly - 3 sets - 10 reps - Shoulder Overhead Press in Abduction with Dumbbells  - 1 x daily - 7 x weekly - 3 sets - 10 reps - Standing Bicep Curls Supinated with Dumbbells  - 1 x daily - 7 x weekly - 3 sets - 10 reps   ASSESSMENT:   CLINICAL IMPRESSION:  PT was continued for L shoulder strengthening with increased resistance and positional demand. Perturbations were added to simulate physical contact and Thurston joint strain. Also, hand held dynamometer  testing was completed for shoulder ER with the L within 90% of the R. Large gentle shoulders circles did not aggravate the L shoulder. Pt is progressing with L shoulder strength and tolerance to physical demand. Recently, only falling on the the L shoulder has aggrevated pt's L shoulder pain. Pt tolerated PT today without adverse effects. Pt will continue to benefit from skilled PT to address impairments for improved function.  OBJECTIVE IMPAIRMENTS decreased strength and pain.    ACTIVITY LIMITATIONS sleeping and certain l shoulder movements, playing soccer   PARTICIPATION LIMITATIONS:  competitive recreation soccer   PERSONAL FACTORS Past/current experiences and Time since onset of injury/illness/exacerbation are also affecting patient's functional outcome.    REHAB POTENTIAL: Good   CLINICAL DECISION MAKING: Evolving/moderate complexity   EVALUATION COMPLEXITY: Moderate     GOALS:     SHORT TERM GOALS: Target date: 05/13/2022    Pt will be Ind in an initial HEP Baseline:initiated Goal status: MET   LONG TERM GOALS: Target date: 06/17/22   Pt will be Ind in a final HEP to maintain achieved LOF Baseline: initiated Goal status: Ongoing   2.  Pt will be able to complete competitive recreation soccer with infrequent L shoulder pain at a 3/10 or less level Baseline: Status: 05/26/22  Goal status: Improving   3.  Increase L shoulder ER strength to 5/5 for improved function Baseline: 4+/5 Status: See flow sheets Goal status: MET     PLAN: PT FREQUENCY: 1x/week   PT DURATION: 6 weeks   PLANNED INTERVENTIONS: Therapeutic exercises, Therapeutic activity, Patient/Family education, Self Care, Joint mobilization, Dry Needling, Electrical stimulation, Cryotherapy, Moist heat, Taping, Ultrasound, Ionotophoresis 82m/ml Dexamethasone, Manual therapy, and Re-evaluation   PLAN FOR NEXT SESSION: Review QJeanie Cooks assess response to HEP; progress therex as indicated; use of modalities,  manual therapy; and TPDN as indicated. Assess Quick Dash and L shoulder strength per MMT.     Samarion Ehle MS, PT 06/07/22 4:26 PM

## 2022-06-14 ENCOUNTER — Ambulatory Visit: Payer: Medicaid Other

## 2022-06-14 DIAGNOSIS — G8929 Other chronic pain: Secondary | ICD-10-CM

## 2022-06-14 DIAGNOSIS — M25512 Pain in left shoulder: Secondary | ICD-10-CM | POA: Diagnosis not present

## 2022-06-14 DIAGNOSIS — M6281 Muscle weakness (generalized): Secondary | ICD-10-CM

## 2022-06-14 NOTE — Therapy (Addendum)
OUTPATIENT PHYSICAL THERAPY TREATMENT NOTE/DC   Philip Jenkins Name: Philip Jenkins MRN: [id number] DOB:September 29, 2000, 21 y.o., male Today's Date: 06/14/2022  PCP: Marijo File, MD REFERRING PROVIDER: Marijo File, MD  END OF SESSION:   PT End of Session - 06/14/22 1516     Visit Number 6    Number of Visits 7    Date for PT Re-Evaluation 06/17/22     Authorization Type [place] MEDICAID [employer]    PT Start Time 1510   pt arrived 10 min late   PT Stop Time 1545    PT Time Calculation (min) 35 min    Activity Tolerance Philip Jenkins tolerated treatment well    Behavior During Therapy WFL for tasks assessed/performed                 Past Medical History:  Diagnosis Date   Angio-edema    Urticaria    History reviewed. No pertinent surgical history. 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   Acne 05/07/2020    REFERRING DIAG: M25.512 (ICD-10-CM) - Left shoulder pain, unspecified chronicity  THERAPY DIAG:  Chronic left shoulder pain  Muscle weakness (generalized)  Rationale for Evaluation and Treatment Rehabilitation   OBJECTIVE: (objective measures completed at initial evaluation unless otherwise dated)  SUBJECTIVE:                                                                                                                                                                                       SUBJECTIVE STATEMENT: Pt reports it felt like his L shoulder dislocacted briefly when lying on his back and reaching under a bed. Pt notes he has participated in 2 soccer practices with light contact without L shoulder pain.                            PAIN:  Are you having pain? Yes: NPRS scale: 0/10 Pain location: post L GH area Pain description: infrequent and sharp; occasional popping Aggravating factors: certain movements of his L arm when playing soccer, sleeping on his L  shoulder, reaching behind Relieving factors: Rest   Philip Jenkins GOALS To not have L shoulder pain   OBJECTIVE: (objective measures completed at initial evaluation unless otherwise dated)   DIAGNOSTIC FINDINGS:  Limited ultrasound: Left shoulder: 01/27/22   Effusion noted within the bicipital sheath. Normal-appearing subscapularis. There appears to be effusion at the deep portion of the supraspinatus near the glenohumeral joint. No changes in the posterior glenohumeral joint   Summary: Findings most consistent with a labral  tear   Ultrasound and interpretation by Clare Gandy, MD   MRIL shoulder 02/05/22 IMPRESSION: 1. Intact rotator cuff. 2. There is marrow edema and cortical concavity within the posterolateral humeral head. There is a cartilage defect and chondrolabral tear within the anterior glenoid labrum at the 9 o'clock location. Although the humeral injury is lower than a conventional "Hill-Sachs" lesion, these findings may be secondary to the sequela of prior anterior shoulder dislocation. Recommend clinical correlation.     Philip Jenkins SURVEYS:  Quick Dash 50%   COGNITION:           Overall cognitive status: Within functional limits for tasks assessed                                  SENSATION: WFL   POSTURE: WNLs   UPPER EXTREMITY ROM:             L WNLs and equal to the R. Pain is intermittent with L shoulder abd Active ROM Right eval Left eval  Shoulder flexion      Shoulder extension      Shoulder abduction      Shoulder adduction      Shoulder internal rotation      Shoulder external rotation      Elbow flexion      Elbow extension      Wrist flexion      Wrist extension      Wrist ulnar deviation      Wrist radial deviation      Wrist pronation      Wrist supination      (Blank rows = not tested)   UPPER EXTREMITY MMT:   MMT Right eval Left eval RT 06/07/22 LT 06/07/22 RT 06/14/22 LT 06/14/22  Shoulder flexion 5 5      Shoulder extension  5 5      Shoulder abduction 5 5      Shoulder adduction 5 5      Shoulder internal rotation 5 5      Shoulder external rotation 5 4+ discomfort 5/5 HHD=27.0 ave 5/5 no discomfort HHD=24.3 ave  HHD IR=29.5 Flex=28.5 HHD IR=29 Flex=29  Middle trapezius          Lower trapezius          Elbow flexion          Elbow extension          Wrist flexion          Wrist extension          Wrist ulnar deviation          Wrist radial deviation          Wrist pronation          Wrist supination          Grip strength (lbs)          (Blank rows = not tested)   SHOULDER SPECIAL TESTS:            Impingement tests: Hawkins/Kennedy impingement test: negative            SLAP lesions: Crank test: positive , Biceps load test: negative, and Clunk test: negative            Instability tests: Posterior drawer test: negative            Rotator cuff assessment: Empty can test: negative and Full can test: negative  Biceps assessment: Yergason's test: negative and Speed's test: negative            Crank test was inconsistently painful   JOINT MOBILITY TESTING:  Anterior, posterior and lateral mobs were stable   PALPATION:  TTP on the post GH jt area             TODAY'S TREATMENT:  [facility] Adult PT Treatment:                                                DATE: 06/14/22 Therapeutic Exercise: UBE L2 2 mins forward/backwards Wall lateral plank L elbow single arm push up 20 Push ups 20 Push ups/low hike position 2x8 Counter push up claps 20 Perturbations with green swiss ball in supine and standing 2 sets each 2 handed wall clocks c GTB 3 sets HH dynamometer strength testing for shoulder IR, and flexion Large gentle shoulder circles x10 each direction Trap red ball catch for ecc control and stability 2x20  [facility] Adult PT Treatment:                                                DATE: 06/07/22 Therapeutic Exercise: UBE L2 2 mins forward/backwards Wall lateral plank L elbow single arm  push up 2x15 Push ups 2x10 Push ups/low hike position 2x8 Counter push up claps 2x10 Perturbations with green swiss ball in supine and standing 2 sets each 2 handed wall clocks c GTB 3 sets HH dynamometer strength testing for shoulder ER Large gentle shoulder circles x10 each direction  [facility] Adult PT Treatment:                                                DATE: 05/26/22 Therapeutic Exercise: UBE L1 2 mins forward/backwards Shoulder ER stabilization per side steps BluTB 3x8 Shoulder IR stabilization per side steps BluTB 3x8 Trap red ball catch c ecc control 2x20 Omega chest press 45# 3x10 Omega chest add flys 45# 3x10 Omega chest pulls 45# 3x10 Omega bicep curls 35# 3x10    Philip Jenkins EDUCATION: Education details: Eval findings, POC, HEP Person educated: Philip Jenkins Education method: Explanation, Demonstration, Tactile cues, Verbal cues, and Handouts Education comprehension: verbalized understanding, returned demonstration, verbal cues required, and tactile cues required     HOME EXERCISE PROGRAM: Access Code: [email or web address] URL: [email or web address] Date: 05/18/2022 Prepared by: Joellyn Rued  Exercises - Shoulder External Rotation with Anchored Resistance  - 1 x daily - 7 x weekly - 3 sets - 10 reps - 3 hold - Standing Shoulder Internal Rotation with Anchored Resistance  - 1 x daily - 7 x weekly - 3 sets - 10 reps - 3 hold - Standing Shoulder Row with Anchored Resistance  - 1 x daily - 7 x weekly - 3 sets - 10 reps - 3 hold - Shoulder extension with resistance - Neutral  - 1 x daily - 7 x weekly - 3 sets - 10 reps - 3 hold - Push Up on Table  - 1 x daily - 7 x weekly - 3 sets - 10 reps - 3 hold -  Shoulder Taps on Table  - 1 x daily - 7 x weekly - 3 sets - 10 reps - 3 hold - Standing Full Side Plank on Wall  - 1 x daily - 7 x weekly - 3 sets - 10 reps - Shoulder Overhead Press in Abduction with Dumbbells  - 1 x daily - 7 x weekly - 3 sets - 10 reps - Standing Bicep Curls  Supinated with Dumbbells  - 1 x daily - 7 x weekly - 3 sets - 10 reps   ASSESSMENT:   CLINICAL IMPRESSION: PT was continued for L shoulder strengthening and stability with increased resistance and positional demand. Additional strength testing today c a HHD was completed and found the L shoulder to be equal to the R arm. Pt issues with L shoulder pain and instability continued to improved with the higher demand activity of recreational soccer and with his usual daily activities. The pt has the most issue with the L shoulder movement pattern of reaching behind and overhead. Pt has made good progress with strength and function of the L shoulder, but pain and instability still occur infrequently. Will continue PT 1 more session to establish a final HEP.   OBJECTIVE IMPAIRMENTS decreased strength and pain.    ACTIVITY LIMITATIONS sleeping and certain l shoulder movements, playing soccer   PARTICIPATION LIMITATIONS:  competitive recreation soccer   PERSONAL FACTORS Past/current experiences and Time since onset of injury/illness/exacerbation are also affecting Philip Jenkins's functional outcome.    REHAB POTENTIAL: Good   CLINICAL DECISION MAKING: Evolving/moderate complexity   EVALUATION COMPLEXITY: Moderate     GOALS:     SHORT TERM GOALS: Target date: 05/13/2022    Pt will be Ind in an initial HEP Baseline:initiated Goal status: MET   LONG TERM GOALS: Target date: 06/17/22   Pt will be Ind in a final HEP to maintain achieved LOF Baseline: initiated Goal status: Ongoing   2.  Pt will be able to complete competitive recreation soccer with infrequent L shoulder pain at a 3/10 or less level Baseline: Status: 05/26/22  Goal status: Improving   3.  Increase L shoulder ER strength to 5/5 for improved function Baseline: 4+/5 Status: See flow sheets Goal status: MET     PLAN: PT FREQUENCY: 1x/week   PT DURATION: 6 weeks   PLANNED INTERVENTIONS: Therapeutic exercises, Therapeutic  activity, Philip Jenkins/Family education, Self Care, Joint mobilization, Dry Needling, Electrical stimulation, Cryotherapy, Moist heat, Taping, Ultrasound, Ionotophoresis 4mg /ml Dexamethasone, Manual therapy, and Re-evaluation   PLAN FOR NEXT SESSION: Review Berneda Rose; assess response to HEP; progress therex as indicated; use of modalities, manual therapy; and TPDN as indicated. Assess Quick Dash and L shoulder strength per MMT.     Shakera Ebrahimi MS, PT 06/14/22 5:36 PM  PHYSICAL THERAPY DISCHARGE SUMMARY  Visits from Start of Care: 6  Current functional level related to goals / functional outcomes: See clinical impression and PT goals    Remaining deficits: See clinical impression and PT goals    Education / Equipment: HEP, pt ed   Philip Jenkins agrees to discharge. Philip Jenkins goals were partially met. Philip Jenkins is being discharged due to not returning since the last visit.  Salayah Meares MS, PT 09/07/23 4:50 PM

## 2022-06-21 ENCOUNTER — Ambulatory Visit: Payer: Medicaid Other

## 2022-06-21 NOTE — Therapy (Incomplete)
OUTPATIENT PHYSICAL THERAPY TREATMENT NOTE   Patient Name: Philip Jenkins MRN: [id number] DOB:Jun 24, 2001, 21 y.o., male Today's Date: 06/14/2022  PCP: Ok Edwards, MD REFERRING PROVIDER: Ok Edwards, MD  END OF SESSION:   PT End of Session - 06/14/22 1516     Visit Number 6    Number of Visits 7    Date for PT Re-Evaluation 06/17/22     Authorization Type [place] MEDICAID [employer]    PT Start Time 1510   pt arrived 10 min late   PT Stop Time 1545    PT Time Calculation (min) 35 min    Activity Tolerance Patient tolerated treatment well    Behavior During Therapy WFL for tasks assessed/performed                 Past Medical History:  Diagnosis Date   Angio-edema    Urticaria    History reviewed. No pertinent surgical history. 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   Acne 05/07/2020    REFERRING DIAG: M25.512 (ICD-10-CM) - Left shoulder pain, unspecified chronicity  THERAPY DIAG:  Chronic left shoulder pain  Muscle weakness (generalized)  Rationale for Evaluation and Treatment Rehabilitation   OBJECTIVE: (objective measures completed at initial evaluation unless otherwise dated)  SUBJECTIVE:                                                                                                                                                                                       SUBJECTIVE STATEMENT: Pt reports it felt like his L shoulder dislocacted briefly when lying on his back and reaching under a bed. Pt notes he has participated in 2 soccer practices with light contact without L shoulder pain.                            PAIN:  Are you having pain? Yes: NPRS scale: 0/10 Pain location: post L GH area Pain description: infrequent and sharp; occasional popping Aggravating factors: certain movements of his L arm when playing soccer, sleeping on his L  shoulder, reaching behind Relieving factors: Rest   PATIENT GOALS To not have L shoulder pain   OBJECTIVE: (objective measures completed at initial evaluation unless otherwise dated)   DIAGNOSTIC FINDINGS:  Limited ultrasound: Left shoulder: 01/27/22   Effusion noted within the bicipital sheath. Normal-appearing subscapularis. There appears to be effusion at the deep portion of the supraspinatus near the glenohumeral joint. No changes in the posterior glenohumeral joint   Summary: Findings most consistent with a labral  tear   Ultrasound and interpretation by Clearance Coots, MD   MRIL shoulder 02/05/22 IMPRESSION: 1. Intact rotator cuff. 2. There is marrow edema and cortical concavity within the posterolateral humeral head. There is a cartilage defect and chondrolabral tear within the anterior glenoid labrum at the 9 o'clock location. Although the humeral injury is lower than a conventional "Hill-Sachs" lesion, these findings may be secondary to the sequela of prior anterior shoulder dislocation. Recommend clinical correlation.     PATIENT SURVEYS:  Quick Dash 50%   COGNITION:           Overall cognitive status: Within functional limits for tasks assessed                                  SENSATION: WFL   POSTURE: WNLs   UPPER EXTREMITY ROM:             L WNLs and equal to the R. Pain is intermittent with L shoulder abd Active ROM Right eval Left eval  Shoulder flexion      Shoulder extension      Shoulder abduction      Shoulder adduction      Shoulder internal rotation      Shoulder external rotation      Elbow flexion      Elbow extension      Wrist flexion      Wrist extension      Wrist ulnar deviation      Wrist radial deviation      Wrist pronation      Wrist supination      (Blank rows = not tested)   UPPER EXTREMITY MMT:   MMT Right eval Left eval RT 06/07/22 LT 06/07/22 RT 06/14/22 LT 06/14/22  Shoulder flexion 5 5      Shoulder extension  5 5      Shoulder abduction 5 5      Shoulder adduction 5 5      Shoulder internal rotation 5 5      Shoulder external rotation 5 4+ discomfort 5/5 HHD=27.0 ave 5/5 no discomfort HHD=24.3 ave  HHD IR=29.5 Flex=28.5 HHD IR=29 Flex=29  Middle trapezius          Lower trapezius          Elbow flexion          Elbow extension          Wrist flexion          Wrist extension          Wrist ulnar deviation          Wrist radial deviation          Wrist pronation          Wrist supination          Grip strength (lbs)          (Blank rows = not tested)   SHOULDER SPECIAL TESTS:            Impingement tests: Hawkins/Kennedy impingement test: negative            SLAP lesions: Crank test: positive , Biceps load test: negative, and Clunk test: negative            Instability tests: Posterior drawer test: negative            Rotator cuff assessment: Empty can test: negative and Full can test: negative  Biceps assessment: Yergason's test: negative and Speed's test: negative            Crank test was inconsistently painful   JOINT MOBILITY TESTING:  Anterior, posterior and lateral mobs were stable   PALPATION:  TTP on the post [place] j[place] area             TODAY'S TREATMENT:  [facility] Adult PT Treatment:                                                DATE: 06/11/22 Therapeutic Exercise: *** Manual Therapy: *** Neuromuscular re-ed: *** Therapeutic Activity: *** Modalities: *** Self Care: ***  [facility] Adult PT Treatment:                                                DATE: 06/14/22 Therapeutic Exercise: UBE L2 2 mins forward/backwards Wall lateral plank L elbow single arm push up 20 Push ups 20 Push ups/low hike position 2x8 Counter push up claps 20 Perturbations with green swiss ball in supine and standing 2 sets each 2 handed wall clocks c GTB 3 sets HH dynamometer strength testing for shoulder IR, and flexion Large gentle shoulder circles x10 each direction Trap red ball  catch for ecc control and stability 2x20  [facility] Adult PT Treatment:                                                DATE: 06/07/22 Therapeutic Exercise: UBE L2 2 mins forward/backwards Wall lateral plank L elbow single arm push up 2x15 Push ups 2x10 Push ups/low hike position 2x8 Counter push up claps 2x10 Perturbations with green swiss ball in supine and standing 2 sets each 2 handed wall clocks c GTB 3 sets HH dynamometer strength testing for shoulder ER Large gentle shoulder circles x10 each direction  [facility] Adult PT Treatment:                                                DATE: 05/26/22 Therapeutic Exercise: UBE L1 2 mins forward/backwards Shoulder ER stabilization per side steps BluTB 3x8 Shoulder IR stabilization per side steps BluTB 3x8 Trap red ball catch c ecc control 2x20 Omega chest press 45# 3x10 Omega chest add flys 45# 3x10 Omega chest pulls 45# 3x10 Omega bicep curls 35# 3x10    PATIENT EDUCATION: Education details: Eval findings, POC, HEP Person educated: Patient Education method: Explanation, Demonstration, Tactile cues, Verbal cues, and Handouts Education comprehension: verbalized understanding, returned demonstration, verbal cues required, and tactile cues required     HOME EXERCISE PROGRAM: Access Code: [email or web address] URL: [email or web address] Date: 05/18/2022 Prepared by: Gar Ponto  Exercises - Shoulder External Rotation with Anchored Resistance  - 1 x daily - 7 x weekly - 3 sets - 10 reps - 3 hold - Standing Shoulder Internal Rotation with Anchored Resistance  - 1 x daily - 7 x weekly - 3 sets - 10 reps - 3 hold - Standing Shoulder  Row with Anchored Resistance  - 1 x daily - 7 x weekly - 3 sets - 10 reps - 3 hold - Shoulder extension with resistance - Neutral  - 1 x daily - 7 x weekly - 3 sets - 10 reps - 3 hold - Push Up on Table  - 1 x daily - 7 x weekly - 3 sets - 10 reps - 3 hold - Shoulder Taps on Table  - 1 x daily - 7 x weekly - 3 sets  - 10 reps - 3 hold - Standing Full Side Plank on Wall  - 1 x daily - 7 x weekly - 3 sets - 10 reps - Shoulder Overhead Press in Abduction with Dumbbells  - 1 x daily - 7 x weekly - 3 sets - 10 reps - Standing Bicep Curls Supinated with Dumbbells  - 1 x daily - 7 x weekly - 3 sets - 10 reps   ASSESSMENT:   CLINICAL IMPRESSION: PT was continued for L shoulder strengthening and stability with increased resistance and positional demand. Additional strength testing today c a HHD was completed and found the L shoulder to be equal to the R arm. Pt issues with L shoulder pain and instability continued to improved with the higher demand activity of recreational soccer and with his usual daily activities. The pt has the most issue with the L shoulder movement pattern of reaching behind and overhead. Pt has made good progress with strength and function of the L shoulder, but pain and instability still occur infrequently. Will continue PT 1 more session to establish a final HEP.   OBJECTIVE IMPAIRMENTS decreased strength and pain.    ACTIVITY LIMITATIONS sleeping and certain l shoulder movements, playing soccer   PARTICIPATION LIMITATIONS:  competitive recreation soccer   PERSONAL FACTORS Past/current experiences and Time since onset of injury/illness/exacerbation are also affecting patient's functional outcome.    REHAB POTENTIAL: Good   CLINICAL DECISION MAKING: Evolving/moderate complexity   EVALUATION COMPLEXITY: Moderate     GOALS:     SHORT TERM GOALS: Target date: 05/13/2022    Pt will be Ind in an initial HEP Baseline:initiated Goal status: MET   LONG TERM GOALS: Target date: 06/17/22   Pt will be Ind in a final HEP to maintain achieved LOF Baseline: initiated Goal status: Ongoing   2.  Pt will be able to complete competitive recreation soccer with infrequent L shoulder pain at a 3/10 or less level Baseline: Status: 05/26/22  Goal status: Improving   3.  Increase L shoulder ER  strength to 5/5 for improved function Baseline: 4+/5 Status: See flow sheets Goal status: MET     PLAN: PT FREQUENCY: 1x/week   PT DURATION: 6 weeks   PLANNED INTERVENTIONS: Therapeutic exercises, Therapeutic activity, Patient/Family education, Self Care, Joint mobilization, Dry Needling, Electrical stimulation, Cryotherapy, Moist heat, Taping, Ultrasound, Ionotophoresis 60m/ml Dexamethasone, Manual therapy, and Re-evaluation   PLAN FOR NEXT SESSION: Review QJeanie Cooks assess response to HEP; progress therex as indicated; use of modalities, manual therapy; and TPDN as indicated. Assess Quick Dash and L shoulder strength per MMT.     Shashana Fullington MS, PT 06/14/22 5:36 PM

## 2022-11-07 ENCOUNTER — Encounter: Payer: Self-pay | Admitting: *Deleted

## 2022-12-07 ENCOUNTER — Encounter: Payer: Self-pay | Admitting: Family Medicine

## 2023-11-21 IMAGING — DX DG CHEST 1V PORT
1 series · 1 of 1 positions shown · non-contrast
Comparison: 11/13/2015.

CLINICAL DATA: Chest pain difficulty ambulating, rash.

EXAM:
PORTABLE CHEST 1 VIEW

[chest ap]
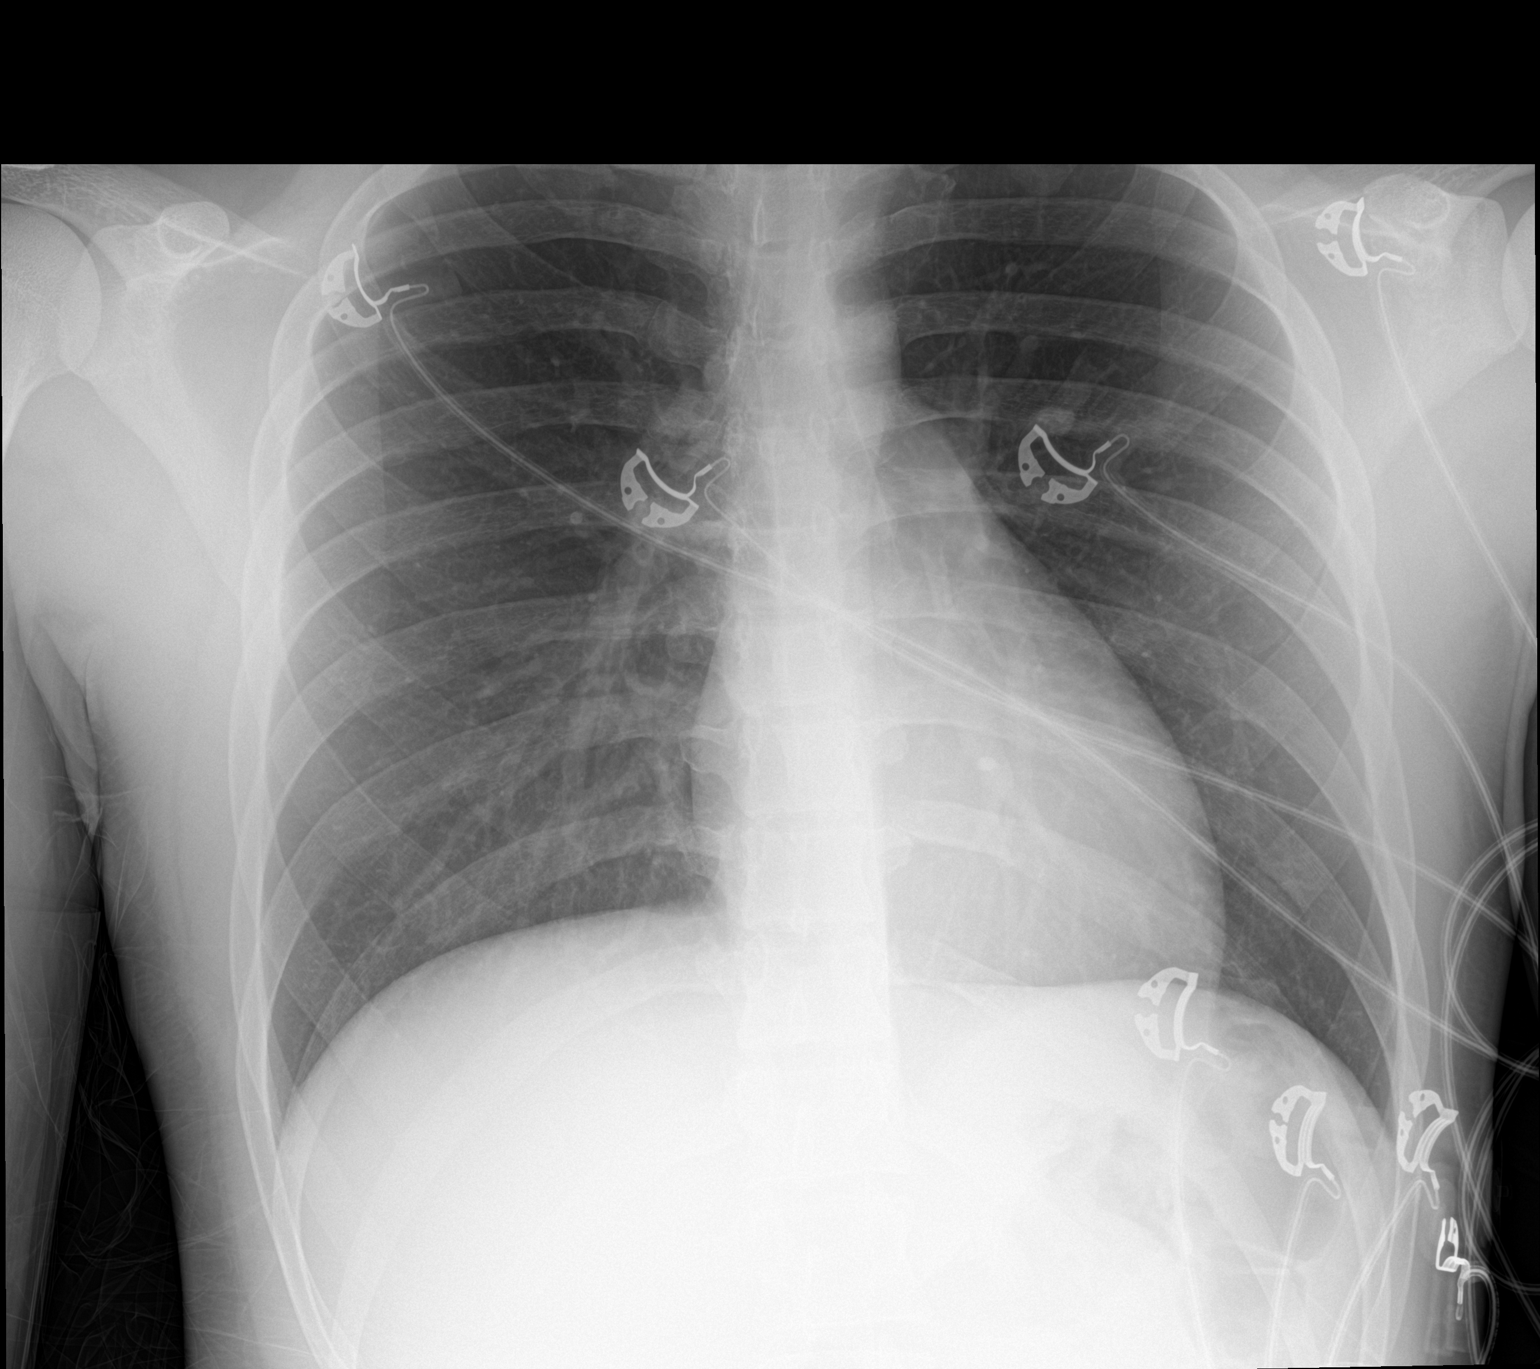

[1 of 1 positions shown; findings below may reference images not displayed]

FINDINGS: Trachea is midline. Heart size normal. Lungs are clear. No pleural
fluid.
IMPRESSION: Negative.

## 2023-11-28 ENCOUNTER — Telehealth: Payer: Self-pay

## 2023-11-28 NOTE — Telephone Encounter (Signed)
  __x_ [facility] Forms received via Mychart/nurse line printed off by RN __n/a_ Nurse portion completed _x__ Forms/notes placed in Providers folder for review and signature.Stuart Ellis) ___ Forms completed by Provider and placed in completed Provider folder for office leadership pick up ___Forms completed by Provider and faxed to designated location, encounter closed

## 2023-12-20 ENCOUNTER — Ambulatory Visit: Attending: Orthopedic Surgery | Admitting: Physical Therapy

## 2023-12-20 ENCOUNTER — Encounter: Payer: Self-pay | Admitting: Physical Therapy

## 2023-12-20 ENCOUNTER — Other Ambulatory Visit: Payer: Self-pay

## 2023-12-20 DIAGNOSIS — M6281 Muscle weakness (generalized): Secondary | ICD-10-CM | POA: Diagnosis present

## 2023-12-20 DIAGNOSIS — G8929 Other chronic pain: Secondary | ICD-10-CM | POA: Diagnosis present

## 2023-12-20 DIAGNOSIS — M25512 Pain in left shoulder: Secondary | ICD-10-CM | POA: Diagnosis present

## 2023-12-20 NOTE — Therapy (Signed)
 OUTPATIENT PHYSICAL THERAPY SHOULDER EVALUATION   Patient Name: Philip Jenkins MRN: [id number] DOB:[birth year], 23 y.o., male Today's Date: 12/20/2023  END OF SESSION:  PT End of Session - 12/20/23 1259     Visit Number 1    Number of Visits 13    Date for PT Re-Evaluation 01/31/24    Authorization Type [employer] and [employer]    Authorization Time Period 12/20/23 to 02/14/24    PT Start Time 1158   at front desk/clarifying insurance/visits   PT Stop Time 1228    PT Time Calculation (min) 30 min    Activity Tolerance Patient tolerated treatment well    Behavior During Therapy [facility] for tasks assessed/performed             Past Medical History:  Diagnosis Date   Angio-edema    Urticaria    History reviewed. No pertinent surgical history. 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   Acne 05/07/2020    PCP: no PCP   REFERRING PROVIDER: Ellard Gunning, MD  REFERRING DIAG: Diagnosis Z47.89 (ICD-10-CM) - Encounter for other orthopedic aftercare  THERAPY DIAG:  Chronic left shoulder pain  Muscle weakness (generalized)  Rationale for Evaluation and Treatment: Rehabilitation  ONSET DATE: surgery March 18th 2025  SUBJECTIVE:                                                                                                                                                                                      SUBJECTIVE STATEMENT:   Had multiple shoulder dislocations playing soccer and ended up getting shoulder surgery. Ended up going to another clinic to start PT as this clinic was booked, but this location is much closer to my house, shoulder has been feeling a lot better. Not too hard for me to anything with the shoulder but not back to sports based things yet. No known precautions at this point, pretty far out.     Hand dominance: Right  PERTINENT HISTORY: See above   PAIN:  Are you having pain?  No 0/10  PRECAUTIONS: None  RED FLAGS: None   WEIGHT BEARING RESTRICTIONS: No  FALLS:  Has patient fallen in last 6 months? Yes. 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    PATIENT SURVEYS:    Patient-Specific Activity Scoring Scheme  "0" represents "unable to perform." "10" represents "able to perform at prior level. 0 1 2 3 4 5 6 7 8 9  10 (Date and Score)   Activity Eval     1. Gym activity  5    2. Soccer  3    3.     4.    5.    Score 4    Total score = sum of the activity scores/number of activities Minimum detectable change (90%CI) for average score = 2 points Minimum detectable change (90%CI) for single activity score = 3 points     COGNITION: Overall cognitive status: Within functional limits for tasks assessed       UPPER EXTREMITY ROM:   Active ROM Right eval Left eval  Shoulder flexion 178* 146*  Shoulder extension    Shoulder abduction 178* 148*  Shoulder adduction    Shoulder internal rotation T8 T7  Shoulder external rotation T4  T4  Elbow flexion    Elbow extension    Wrist flexion    Wrist extension    Wrist ulnar deviation    Wrist radial deviation    Wrist pronation    Wrist supination    (Blank rows = not tested)  UPPER EXTREMITY MMT:  MMT Right eval Left eval  Shoulder flexion 4+ 4+  Shoulder extension 4+ 4+  Shoulder abduction 4+ 4+  Shoulder adduction    Shoulder internal rotation 4+ 4+  Shoulder external rotation 4+ 4+  Middle trapezius 4 4  Lower trapezius    Elbow flexion    Elbow  extension    Wrist flexion    Wrist extension    Wrist ulnar deviation    Wrist radial deviation    Wrist pronation    Wrist supination    Grip strength (lbs)    (Blank rows = not tested)                                                                                                                                 TREATMENT DATE:   12/20/23  Eval, HEP updates, POC   [place] x5 each 0# (HEP review) UBE L4x8 minutes (4 forward, 4 backward)     PATIENT EDUCATION: Education details: exam findings, POC, HEP updates  Person educated: Patient Education method: Programmer, multimedia, Demonstration, and Handouts Education comprehension: verbalized understanding, returned demonstration, and needs further education  HOME EXERCISE PROGRAM:  Roxan Copes 6 + previously established HEP from MD's office   ASSESSMENT:  CLINICAL IMPRESSION: Patient is a 23 y.o. M who was seen today for physical therapy evaluation and treatment for Diagnosis Z47.89 (ICD-10-CM) - Encounter for other orthopedic aftercare. He is really doing well and fairly far out  (about 10 weeks) from his surgery. Had been seeing the PT office in the surgeon's office as they  had post-op appointments sooner than this clinic, however our location is much closer to him and he would like to transfer here. Per anterior labral repair protocol, ok to work on resistance training at this point, looks like his prior clinic had been working on periscapular strength and some ROM.   OBJECTIVE IMPAIRMENTS: decreased ROM, decreased strength, hypomobility, increased fascial restrictions, impaired flexibility, impaired UE functional use, and postural dysfunction.   ACTIVITY LIMITATIONS: carrying, reach over head, hygiene/grooming, and caring for others  PARTICIPATION LIMITATIONS: meal prep, cleaning, laundry, driving, shopping, community activity, and yard work  PERSONAL FACTORS: Age, Past/current experiences, and Time since onset of  injury/illness/exacerbation are also affecting patient's functional outcome.   REHAB POTENTIAL: Excellent  CLINICAL DECISION MAKING: Stable/uncomplicated  EVALUATION COMPLEXITY: Low   GOALS: Goals reviewed with patient? No  SHORT TERM GOALS: Target date: STG = LTG      LONG TERM GOALS: Target date: 01/31/2024    Shoulder and periscapular MMT to be 5/5 in all tested groups  Baseline:  Goal status: INITIAL  2.  L shoulder flexion and abduction ROM to be at least 160*  Baseline:  Goal status: INITIAL  3.  Will be able to run and jog without increased shoulder pain  Baseline:  Goal status: INITIAL  4.  PSFS to have improved by at least 3 points  Baseline:  Goal status: INITIAL  5.  Will be able to participate in sports (as determined and cleared by surgeon) without difficulty or increase in pain  Baseline:  Goal status: INITIAL    PLAN:  PT FREQUENCY: 2x/week  PT DURATION: 6 weeks  PLANNED INTERVENTIONS: 97750- Physical Performance Testing, 97110-Therapeutic exercises, 97530- Therapeutic activity, 97112- Neuromuscular re-education, 97535- Self Care, 40981- Manual therapy, Taping, and Dry Needling  PLAN FOR NEXT SESSION: progress per anterior shoulder labrum repair protocol (10 weeks out at eval)  Next MD appt June 4th   Terrel Ferries, PT, DPT 12/20/23 1:00 PM

## 2023-12-20 NOTE — Therapy (Signed)
 Reached out to referring surgeon's office to clarify if he has any remaining restrictions at this point (10 weeks after surgery)- they will route to appropriate staff and call us  back.  Terrel Ferries, PT, DPT 12/20/23 1:11 PM

## 2023-12-21 ENCOUNTER — Ambulatory Visit: Admitting: Physical Therapy

## 2023-12-21 ENCOUNTER — Encounter: Payer: Self-pay | Admitting: Physical Therapy

## 2023-12-21 DIAGNOSIS — M6281 Muscle weakness (generalized): Secondary | ICD-10-CM

## 2023-12-21 DIAGNOSIS — G8929 Other chronic pain: Secondary | ICD-10-CM

## 2023-12-21 DIAGNOSIS — M25512 Pain in left shoulder: Secondary | ICD-10-CM | POA: Diagnosis not present

## 2023-12-21 NOTE — Therapy (Signed)
 OUTPATIENT PHYSICAL THERAPY SHOULDER EVALUATION   Patient Name: Philip Jenkins MRN: [id number] DOB:[birth year], 23 y.o., male Today's Date: 12/21/2023  END OF SESSION:  PT End of Session - 12/21/23 1431     Visit Number 2    Date for PT Re-Evaluation 01/31/24    PT Start Time 1430    PT Stop Time 1515    PT Time Calculation (min) 45 min    Activity Tolerance Patient tolerated treatment well    Behavior During Therapy [facility] for tasks assessed/performed             Past Medical History:  Diagnosis Date   Angio-edema    Urticaria    History reviewed. No pertinent surgical history. 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   Acne 05/07/2020    PCP: no PCP   REFERRING PROVIDER: Ellard Gunning, MD  REFERRING DIAG: Diagnosis Z47.89 (ICD-10-CM) - Encounter for other orthopedic aftercare  THERAPY DIAG:  Chronic left shoulder pain  Muscle weakness (generalized)  Rationale for Evaluation and Treatment: Rehabilitation  ONSET DATE: surgery March 18th 2025  SUBJECTIVE:                                                                                                                                                                                      SUBJECTIVE STATEMENT:  Good, 9 to 10 weeks since surgery   Hand dominance: Right  PERTINENT HISTORY: See above   PAIN:  Are you having pain? No 0/10  PRECAUTIONS: None  RED FLAGS: None   WEIGHT BEARING RESTRICTIONS: No  FALLS:  Has patient fallen in last 6 months? Yes. 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    PATIENT SURVEYS:    Patient-Specific Activity Scoring Scheme  "0" represents "unable to perform." "10" represents "able to perform at prior level. 0 1 2 3 4 5 6 7 8 9  10 (Date and Score)   Activity Eval     1. Gym activity  5    2. Soccer  3    3.     4.    5.    Score 4    Total score = sum  of the activity scores/number of activities Minimum detectable change (90%CI) for average score = 2 points Minimum detectable change (90%CI) for single activity score = 3 points     COGNITION: Overall cognitive status: Within functional limits for tasks assessed       UPPER EXTREMITY ROM:   Active ROM Right eval Left eval  Shoulder flexion 178* 146*  Shoulder extension    Shoulder abduction 178* 148*  Shoulder adduction    Shoulder internal rotation T8 T7  Shoulder external rotation T4  T4  Elbow flexion    Elbow extension    Wrist flexion    Wrist extension    Wrist ulnar deviation    Wrist radial deviation    Wrist pronation    Wrist supination    (Blank rows = not tested)  UPPER EXTREMITY MMT:  MMT Right eval Left eval  Shoulder flexion 4+ 4+  Shoulder extension 4+ 4+  Shoulder abduction 4+ 4+  Shoulder adduction    Shoulder internal rotation 4+ 4+  Shoulder external rotation 4+ 4+  Middle trapezius 4 4  Lower trapezius    Elbow flexion    Elbow extension    Wrist flexion    Wrist extension    Wrist ulnar deviation    Wrist radial deviation    Wrist pronation    Wrist supination    Grip strength (lbs)    (Blank rows = not tested)                                                                                                                                 TREATMENT DATE:  12/21/23 AAROM 3lb WaTE Flex, Ext, IR up back x10 UBE L 3 x 3 min each  Seated Rows & Ext 20lb 2x10 LUE ER Red 2x15  Shoulder Flex & Abd 3lb 2x10 Standing Rows green  2x15 Shoulder Ext green 2x10  12/20/23  Eval, HEP updates, POC   Blackburn x5 each 0# (HEP review) UBE L4x8 minutes (4 forward, 4 backward)     PATIENT EDUCATION: Education details: exam findings, POC, HEP updates  Person educated: Patient Education method: Programmer, multimedia, Demonstration, and Handouts Education comprehension: verbalized understanding, returned demonstration, and needs further education  HOME EXERCISE PROGRAM:  Roxan Copes 6 + previously established HEP from MD's office   ASSESSMENT:  CLINICAL IMPRESSION: Patient is a 23 y.o. M who was seen today for physical therapy evaluation and treatment for Diagnosis Z47.89 (ICD-10-CM) - Encounter for other orthopedic aftercare. He is really doing well and fairly far out  (about 10 weeks) from his surgery. Continued with periscapular strength and some ROM. Postural compensation present with most movements. Visual LUE shaking with ER, IR, and rows.  OBJECTIVE IMPAIRMENTS: decreased ROM, decreased strength, hypomobility, increased fascial restrictions, impaired flexibility, impaired UE functional use, and postural dysfunction.   ACTIVITY LIMITATIONS: carrying, reach over head, hygiene/grooming, and caring for others  PARTICIPATION LIMITATIONS: meal prep, cleaning, laundry, driving, shopping,  community activity, and yard work  PERSONAL FACTORS: Age, Past/current experiences, and Time since onset of injury/illness/exacerbation are also affecting patient's functional outcome.   REHAB POTENTIAL: Excellent  CLINICAL DECISION MAKING: Stable/uncomplicated  EVALUATION COMPLEXITY: Low   GOALS: Goals reviewed with patient? No  SHORT TERM GOALS: Target date: STG = LTG      LONG TERM GOALS: Target date: 01/31/2024    Shoulder and periscapular MMT to be 5/5 in all tested groups  Baseline:  Goal status: INITIAL  2.  L shoulder flexion and abduction ROM to be at least 160*  Baseline:  Goal status: INITIAL  3.  Will be able to  run and jog without increased shoulder pain  Baseline:  Goal status: INITIAL  4.  PSFS to have improved by at least 3 points  Baseline:  Goal status: INITIAL  5.  Will be able to participate in sports (as determined and cleared by surgeon) without difficulty or increase in pain  Baseline:  Goal status: INITIAL    PLAN:  PT FREQUENCY: 2x/week  PT DURATION: 6 weeks  PLANNED INTERVENTIONS: 97750- Physical Performance Testing, 97110-Therapeutic exercises, 97530- Therapeutic activity, 97112- Neuromuscular re-education, 97535- Self Care, [id number]- Manual therapy, Taping, and Dry Needling  PLAN FOR NEXT SESSION: progress per anterior shoulder labrum repair protocol (10 weeks out at eval)  Next MD appt June 4th   Towanda Fret 12/21/23 2:32 PM

## 2024-01-02 NOTE — Therapy (Signed)
 OUTPATIENT PHYSICAL THERAPY SHOULDER TREATMENT   Patient Name: Philip Jenkins MRN: [id number] DOB:2001-01-24, 23 y.o., male Today's Date: 01/03/2024  END OF SESSION:  PT End of Session - 01/03/24 1405     Visit Number 3    Date for PT Re-Evaluation 01/31/24    PT Start Time 1405    PT Stop Time 1435    PT Time Calculation (min) 30 min    Activity Tolerance Patient tolerated treatment well    Behavior During Therapy [facility] for tasks assessed/performed              Past Medical History:  Diagnosis Date   Angio-edema    Urticaria    No past surgical history on file. 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   Acne 05/07/2020    PCP: no PCP   REFERRING PROVIDER: Ellard Gunning, MD  REFERRING DIAG: Diagnosis Z47.89 (ICD-10-CM) - Encounter for other orthopedic aftercare  THERAPY DIAG:  Chronic left shoulder pain  Muscle weakness (generalized)  Rationale for Evaluation and Treatment: Rehabilitation  ONSET DATE: surgery March 18th 2025  SUBJECTIVE:                                                                                                                                                                                      SUBJECTIVE STATEMENT: Have 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    Hand dominance: Right  PERTINENT HISTORY: See above   PAIN:  Are you having pain? No 0/10  PRECAUTIONS: None  RED FLAGS: None   WEIGHT BEARING RESTRICTIONS: No  FALLS:  Has patient fallen in last 6 months? Yes. 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    PATIENT SURVEYS:    Patient-Specific Activity Scoring Scheme  0 represents "unable to perform." 10 represents "able to perform at prior level. 0 1 2 3 4 5 6 7 8 9  10 (Date and Score)   Activity Eval     1. Gym activity  5    2. Soccer  3    3.     4.    5.    Score  4    Total score = sum of the activity scores/number of activities Minimum detectable change (90%CI) for average score = 2 points Minimum detectable change (90%CI) for single activity score = 3 points     COGNITION: Overall cognitive status: Within functional limits for tasks assessed       UPPER EXTREMITY ROM:   Active ROM Right eval Left eval  Shoulder flexion 178* 146*  Shoulder extension    Shoulder abduction 178* 148*  Shoulder adduction    Shoulder internal rotation T8 T7  Shoulder external rotation T4  T4  Elbow flexion    Elbow extension    Wrist flexion    Wrist extension    Wrist ulnar deviation    Wrist radial deviation    Wrist pronation    Wrist supination    (Blank rows = not tested)  UPPER EXTREMITY MMT:  MMT Right eval Left eval  Shoulder flexion 4+ 4+  Shoulder extension 4+ 4+  Shoulder abduction 4+ 4+  Shoulder adduction    Shoulder internal rotation 4+ 4+  Shoulder external rotation 4+ 4+  Middle trapezius 4 4  Lower trapezius    Elbow flexion    Elbow extension    Wrist flexion    Wrist extension    Wrist ulnar deviation    Wrist radial deviation    Wrist pronation    Wrist supination    Grip strength (lbs)    (Blank rows = not tested)                                                                                                                                 TREATMENT DATE:  01/03/24 UBE L3 x59mins each way Bicep curl 25# x10 Tricep ext 35# x10 Seated row and lat pull down 25# x10 IYT 3# x10 3 way banded  shoulder reaches x10 SA banded lifts x10   12/21/23 AAROM 3lb WaTE Flex, Ext, IR up back x10 UBE L 3 x 3 min each  Seated Rows & Ext 20lb 2x10 LUE ER Red 2x15  Shoulder Flex & Abd 3lb 2x10 Standing Rows green 2x15 Shoulder Ext green 2x10  12/20/23  Eval, HEP updates, POC   Blackburn x5 each 0# (HEP review) UBE L4x8 minutes (4 forward, 4 backward)     PATIENT EDUCATION: Education details: exam findings, POC, HEP updates  Person educated: Patient Education method: Programmer, multimedia, Demonstration, and Handouts Education comprehension: verbalized understanding, returned demonstration, and needs further education  HOME EXERCISE PROGRAM:  Roxan Copes 6 + previously established HEP from MD's office   ASSESSMENT:  CLINICAL IMPRESSION:  He is really doing well. Continued with periscapular strength and some ROM. He asked for a comprehensive HEP program including machines, free weights, and bands to continue doing on his own. Pt started a new internship in [place] so will have some difficulty returning often for PT. But he would like to come up until his surgeon clears him.  Will see Dr. 01/10/24. We went over form for all of his HEP today and advised him to start with low weights and to not overdo as he is still healing. Patient had to leave early to attend his cousin's graduation.    OBJECTIVE IMPAIRMENTS: decreased ROM, decreased strength, hypomobility, increased fascial restrictions, impaired flexibility, impaired UE functional use, and postural dysfunction.   ACTIVITY LIMITATIONS: carrying, reach over head, hygiene/grooming, and caring for others  PARTICIPATION LIMITATIONS: meal prep, cleaning, laundry, driving, shopping, community activity, and yard work  PERSONAL FACTORS: Age, Past/current experiences, and Time since onset of injury/illness/exacerbation are also affecting patient's functional outcome.   REHAB POTENTIAL: Excellent  CLINICAL DECISION MAKING:  Stable/uncomplicated  EVALUATION COMPLEXITY: Low   GOALS: Goals reviewed with patient? No  SHORT TERM GOALS: Target date: STG = LTG      LONG TERM GOALS: Target date: 01/31/2024    Shoulder and periscapular MMT to be 5/5 in all tested groups  Baseline:  Goal status: INITIAL  2.  L shoulder flexion and abduction ROM to be at least 160*  Baseline:  Goal status: MET 01/03/24  3.  Will be able to run and jog without increased shoulder pain  Baseline:  Goal status: progressing started some pick up games but nothing serious  01/03/24  4.  PSFS to have improved by at least 3 points  Baseline:  Goal status: INITIAL  5.  Will be able to participate in sports (as determined and cleared by surgeon) without difficulty or increase in pain  Baseline:  Goal status: INITIAL    PLAN:  PT FREQUENCY: 2x/week  PT DURATION: 6 weeks  PLANNED INTERVENTIONS: 97750- Physical Performance Testing, 97110-Therapeutic exercises, 97530- Therapeutic activity, 97112- Neuromuscular re-education, 97535- Self Care, [id number]- Manual therapy, Taping, and Dry Needling  PLAN FOR NEXT SESSION: progress per anterior shoulder labrum repair protocol (10 weeks out at eval)  Next MD appt June 18th   Donavon Fudge, PT, DPT 01/03/24 2:42 PM

## 2024-01-03 ENCOUNTER — Ambulatory Visit: Attending: Orthopedic Surgery

## 2024-01-03 DIAGNOSIS — G8929 Other chronic pain: Secondary | ICD-10-CM | POA: Insufficient documentation

## 2024-01-03 DIAGNOSIS — M25512 Pain in left shoulder: Secondary | ICD-10-CM | POA: Diagnosis present

## 2024-01-03 DIAGNOSIS — M6281 Muscle weakness (generalized): Secondary | ICD-10-CM | POA: Insufficient documentation

## 2024-01-18 ENCOUNTER — Ambulatory Visit: Admitting: Physical Therapy

## 2024-01-20 ENCOUNTER — Other Ambulatory Visit (HOSPITAL_COMMUNITY): Payer: Self-pay

## 2024-08-20 ENCOUNTER — Ambulatory Visit: Admitting: Physician Assistant

## 2024-09-04 ENCOUNTER — Ambulatory Visit: Admitting: Internal Medicine

## 2024-09-17 ENCOUNTER — Ambulatory Visit: Admitting: Internal Medicine
